# Patient Record
Sex: Female | Born: 1975 | Race: Black or African American | Hispanic: No | Marital: Single | Smoking: Never smoker
Health system: Southern US, Community
[De-identification: ages and names within clinical notes are randomized; demographics above are authoritative.]

## PROBLEM LIST (undated history)

## (undated) DIAGNOSIS — J45909 Unspecified asthma, uncomplicated: Secondary | ICD-10-CM

---

## 2014-02-05 ENCOUNTER — Encounter (HOSPITAL_BASED_OUTPATIENT_CLINIC_OR_DEPARTMENT_OTHER): Payer: Self-pay | Admitting: Emergency Medicine

## 2014-02-05 ENCOUNTER — Emergency Department (HOSPITAL_BASED_OUTPATIENT_CLINIC_OR_DEPARTMENT_OTHER)
Admission: EM | Admit: 2014-02-05 | Discharge: 2014-02-05 | Disposition: A | Discharge status: Home / Self Care | Payer: BC Managed Care – PPO | Attending: Emergency Medicine | Admitting: Emergency Medicine

## 2014-02-05 ENCOUNTER — Emergency Department (HOSPITAL_BASED_OUTPATIENT_CLINIC_OR_DEPARTMENT_OTHER): Payer: BC Managed Care – PPO

## 2014-02-05 DIAGNOSIS — M549 Dorsalgia, unspecified: Secondary | ICD-10-CM | POA: Diagnosis not present

## 2014-02-05 DIAGNOSIS — Z792 Long term (current) use of antibiotics: Secondary | ICD-10-CM | POA: Insufficient documentation

## 2014-02-05 DIAGNOSIS — J45909 Unspecified asthma, uncomplicated: Secondary | ICD-10-CM | POA: Insufficient documentation

## 2014-02-05 DIAGNOSIS — R112 Nausea with vomiting, unspecified: Secondary | ICD-10-CM | POA: Diagnosis not present

## 2014-02-05 DIAGNOSIS — Z3202 Encounter for pregnancy test, result negative: Secondary | ICD-10-CM | POA: Insufficient documentation

## 2014-02-05 DIAGNOSIS — R197 Diarrhea, unspecified: Secondary | ICD-10-CM | POA: Diagnosis not present

## 2014-02-05 DIAGNOSIS — R1011 Right upper quadrant pain: Secondary | ICD-10-CM | POA: Diagnosis present

## 2014-02-05 HISTORY — DX: Unspecified asthma, uncomplicated: J45.909

## 2014-02-05 LAB — URINALYSIS, ROUTINE W REFLEX MICROSCOPIC
Bilirubin Urine: NEGATIVE
GLUCOSE, UA: NEGATIVE mg/dL
Hgb urine dipstick: NEGATIVE
Ketones, ur: NEGATIVE mg/dL
LEUKOCYTES UA: NEGATIVE
Nitrite: NEGATIVE
PH: 7.5 (ref 5.0–8.0)
PROTEIN: NEGATIVE mg/dL
Specific Gravity, Urine: 1.028 (ref 1.005–1.030)
Urobilinogen, UA: 1 mg/dL (ref 0.0–1.0)

## 2014-02-05 LAB — URINE MICROSCOPIC-ADD ON

## 2014-02-05 LAB — COMPREHENSIVE METABOLIC PANEL
ALBUMIN: 3.9 g/dL (ref 3.5–5.2)
ALT: 14 U/L (ref 0–35)
AST: 14 U/L (ref 0–37)
Alkaline Phosphatase: 53 U/L (ref 39–117)
Anion gap: 14 (ref 5–15)
BUN: 12 mg/dL (ref 6–23)
CALCIUM: 9.6 mg/dL (ref 8.4–10.5)
CO2: 25 mEq/L (ref 19–32)
Chloride: 101 mEq/L (ref 96–112)
Creatinine, Ser: 0.7 mg/dL (ref 0.50–1.10)
GFR calc Af Amer: >90 mL/min (ref >90)
GFR calc non Af Amer: >90 mL/min (ref >90)
Glucose, Bld: 146 mg/dL — ABNORMAL HIGH (ref 70–99)
Potassium: 3.9 mEq/L (ref 3.7–5.3)
Sodium: 140 mEq/L (ref 137–147)
Total Bilirubin: 0.2 mg/dL — ABNORMAL LOW (ref 0.3–1.2)
Total Protein: 8.1 g/dL (ref 6.0–8.3)

## 2014-02-05 LAB — CBC WITH DIFFERENTIAL/PLATELET
BASOS ABS: 0 10*3/uL (ref 0.0–0.1)
BASOS PCT: 0 % (ref 0–1)
EOS PCT: 0 % (ref 0–5)
Eosinophils Absolute: 0 10*3/uL (ref 0.0–0.7)
HEMATOCRIT: 36.4 % (ref 36.0–46.0)
Hemoglobin: 12.7 g/dL (ref 12.0–15.0)
Lymphocytes Relative: 13 % (ref 12–46)
Lymphs Abs: 2 10*3/uL (ref 0.7–4.0)
MCH: 27.3 pg (ref 26.0–34.0)
MCHC: 34.9 g/dL (ref 30.0–36.0)
MCV: 78.1 fL (ref 78.0–100.0)
MONO ABS: 0.5 10*3/uL (ref 0.1–1.0)
Monocytes Relative: 3 % (ref 3–12)
Neutro Abs: 13.2 10*3/uL — ABNORMAL HIGH (ref 1.7–7.7)
Neutrophils Relative %: 84 % — ABNORMAL HIGH (ref 43–77)
PLATELETS: 332 10*3/uL (ref 150–400)
RBC: 4.66 MIL/uL (ref 3.87–5.11)
RDW: 13.9 % (ref 11.5–15.5)
WBC: 15.7 10*3/uL — ABNORMAL HIGH (ref 4.0–10.5)

## 2014-02-05 LAB — WET PREP, GENITAL
TRICH WET PREP: NONE SEEN
Yeast Wet Prep HPF POC: NONE SEEN

## 2014-02-05 LAB — PREGNANCY, URINE: Preg Test, Ur: NEGATIVE

## 2014-02-05 LAB — LIPASE, BLOOD: Lipase: 29 U/L (ref 11–59)

## 2014-02-05 MED ORDER — IOHEXOL 300 MG/ML  SOLN
100.0000 mL | Freq: Once | INTRAMUSCULAR | Status: AC | PRN
  Administered 2014-02-05: 100 mL via INTRAVENOUS

## 2014-02-05 MED ORDER — MORPHINE SULFATE 4 MG/ML IJ SOLN
4.0000 mg | Freq: Once | INTRAMUSCULAR | Status: AC
  Administered 2014-02-05: 4 mg via INTRAVENOUS
  Filled 2014-02-05: qty 1

## 2014-02-05 MED ORDER — DEXTROSE 5 % IV SOLN
1.0000 g | Freq: Once | INTRAVENOUS | Status: AC
  Administered 2014-02-05: 1 g via INTRAVENOUS

## 2014-02-05 MED ORDER — ONDANSETRON 4 MG PO TBDP: ORAL_TABLET | ORAL | Status: DC

## 2014-02-05 MED ORDER — ONDANSETRON HCL 4 MG/2ML IJ SOLN
4.0000 mg | Freq: Once | INTRAMUSCULAR | Status: AC
  Administered 2014-02-05: 4 mg via INTRAVENOUS
  Filled 2014-02-05: qty 2

## 2014-02-05 MED ORDER — SODIUM CHLORIDE 0.9 % IV BOLUS (SEPSIS): 1000.0000 mL | Freq: Once | INTRAVENOUS | Status: DC

## 2014-02-05 MED ORDER — DOXYCYCLINE HYCLATE 100 MG PO CAPS: 100.0000 mg | ORAL_CAPSULE | Freq: Two times a day (BID) | ORAL | Status: DC

## 2014-02-05 MED ORDER — CEFTRIAXONE SODIUM 1 G IJ SOLR
INTRAMUSCULAR | Status: AC
  Filled 2014-02-05: qty 10

## 2014-02-05 MED ORDER — HYDROCODONE-ACETAMINOPHEN 5-325 MG PO TABS: 1.0000 | ORAL_TABLET | ORAL | Status: DC | PRN

## 2014-02-05 MED ORDER — IOHEXOL 300 MG/ML  SOLN
50.0000 mL | Freq: Once | INTRAMUSCULAR | Status: AC | PRN
  Administered 2014-02-05: 50 mL via ORAL

## 2014-02-05 MED ORDER — SODIUM CHLORIDE 0.9 % IV BOLUS (SEPSIS)
1000.0000 mL | Freq: Once | INTRAVENOUS | Status: AC
  Administered 2014-02-05: 1000 mL via INTRAVENOUS

## 2014-02-05 NOTE — Discharge Instructions (Signed)
Call and make an appointment to follow-up with your OB/GYN as soon as possible. Take antibiotics as prescribed. Return immediately if you have worsening pain, fever, persistent vomiting or for any concerns.  Abdominal Pain, Women Abdominal (stomach, pelvic, or belly) pain can be caused by many things. It is important to tell your doctor:  The location of the pain.  Does it come and go or is it present all the time?  Are there things that start the pain (eating certain foods, exercise)?  Are there other symptoms associated with the pain (fever, nausea, vomiting, diarrhea)? All of this is helpful to know when trying to find the cause of the pain. CAUSES   Stomach: virus or bacteria infection, or ulcer.  Intestine: appendicitis (inflamed appendix), regional ileitis (Crohn's disease), ulcerative colitis (inflamed colon), irritable bowel syndrome, diverticulitis (inflamed diverticulum of the colon), or cancer of the stomach or intestine.  Gallbladder disease or stones in the gallbladder.  Kidney disease, kidney stones, or infection.  Pancreas infection or cancer.  Fibromyalgia (pain disorder).  Diseases of the female organs:  Uterus: fibroid (non-cancerous) tumors or infection.  Fallopian tubes: infection or tubal pregnancy.  Ovary: cysts or tumors.  Pelvic adhesions (scar tissue).  Endometriosis (uterus lining tissue growing in the pelvis and on the pelvic organs).  Pelvic congestion syndrome (female organs filling up with blood just before the menstrual period).  Pain with the menstrual period.  Pain with ovulation (producing an egg).  Pain with an IUD (intrauterine device, birth control) in the uterus.  Cancer of the female organs.  Functional pain (pain not caused by a disease, may improve without treatment).  Psychological pain.  Depression. DIAGNOSIS  Your doctor will decide the seriousness of your pain by doing an examination.  Blood  tests.  X-rays.  Ultrasound.  CT scan (computed tomography, special type of X-ray).  MRI (magnetic resonance imaging).  Cultures, for infection.  Barium enema (dye inserted in the large intestine, to better view it with X-rays).  Colonoscopy (looking in intestine with a lighted tube).  Laparoscopy (minor surgery, looking in abdomen with a lighted tube).  Major abdominal exploratory surgery (looking in abdomen with a large incision). TREATMENT  The treatment will depend on the cause of the pain.   Many cases can be observed and treated at home.  Over-the-counter medicines recommended by your caregiver.  Prescription medicine.  Antibiotics, for infection.  Birth control pills, for painful periods or for ovulation pain.  Hormone treatment, for endometriosis.  Nerve blocking injections.  Physical therapy.  Antidepressants.  Counseling with a psychologist or psychiatrist.  Minor or major surgery. HOME CARE INSTRUCTIONS   Do not take laxatives, unless directed by your caregiver.  Take over-the-counter pain medicine only if ordered by your caregiver. Do not take aspirin because it can cause an upset stomach or bleeding.  Try a clear liquid diet (broth or water) as ordered by your caregiver. Slowly move to a bland diet, as tolerated, if the pain is related to the stomach or intestine.  Have a thermometer and take your temperature several times a day, and record it.  Bed rest and sleep, if it helps the pain.  Avoid sexual intercourse, if it causes pain.  Avoid stressful situations.  Keep your follow-up appointments and tests, as your caregiver orders.  If the pain does not go away with medicine or surgery, you may try:  Acupuncture.  Relaxation exercises (yoga, meditation).  Group therapy.  Counseling. SEEK MEDICAL CARE IF:   You  notice certain foods cause stomach pain.  Your home care treatment is not helping your pain.  You need stronger pain  medicine.  You want your IUD removed.  You feel faint or lightheaded.  You develop nausea and vomiting.  You develop a rash.  You are having side effects or an allergy to your medicine. SEEK IMMEDIATE MEDICAL CARE IF:   Your pain does not go away or gets worse.  You have a fever.  Your pain is felt only in portions of the abdomen. The right side could possibly be appendicitis. The left lower portion of the abdomen could be colitis or diverticulitis.  You are passing blood in your stools (bright red or black tarry stools, with or without vomiting).  You have blood in your urine.  You develop chills, with or without a fever.  You pass out. MAKE SURE YOU:   Understand these instructions.  Will watch your condition.  Will get help right away if you are not doing well or get worse. Document Released: 01/05/2007 Document Revised: 07/25/2013 Document Reviewed: 01/25/2009 [facility] Patient Information 2015 [employer], [place]. This information is not intended to replace advice given to you by your health care provider. Make sure you discuss any questions you have with your health care provider.

## 2014-02-05 NOTE — ED Notes (Signed)
Pt reports right flank pain intermittent x2 weeks reoccurred tonight more severe than other episodes. Multiple emesis, nausea, and occasional  diarrhea

## 2014-02-05 NOTE — ED Provider Notes (Signed)
CSN: [id number]     Arrival date & time 02/05/14  0251 History   First MD Initiated Contact with Patient 02/05/14 [phone]     Chief Complaint  Patient presents with  . Flank Pain     (Consider location/radiation/quality/duration/timing/severity/associated sxs/prior Treatment) HPI Patient presents with intermittent right upper quadrant and right flank pain for the past 2 weeks. Symptoms last several hours and then resolved spontaneously. Associated with nausea and multiple episodes of vomiting. Begin having an episode this evening around 10:00. Vomiting 7 or 8. Has had several loose stools. No fever or chills. Denies dysuria, hematuria, frequency. No vaginal symptoms. No previous abdominal surgeries. Past Medical History  Diagnosis Date  . Asthma    History reviewed. No pertinent past surgical history. History reviewed. No pertinent family history. History  Substance Use Topics  . Smoking status: Never Smoker   . Smokeless tobacco: Never Used  . Alcohol Use: No   OB History    Gravida Para Term Preterm AB TAB SAB Ectopic Multiple Living   3 2 1 1            Review of Systems  Constitutional: Negative for fever and chills.  Respiratory: Negative for shortness of breath.   Cardiovascular: Negative for chest pain.  Gastrointestinal: Positive for nausea, vomiting, abdominal pain and diarrhea. Negative for blood in stool.  Genitourinary: Positive for flank pain. Negative for dysuria, frequency and hematuria.  Musculoskeletal: Positive for back pain. Negative for neck pain and neck stiffness.  Skin: Negative for rash and wound.  Neurological: Negative for dizziness, weakness, light-headedness, numbness and headaches.  All other systems reviewed and are negative.     Allergies  Review of patient's allergies indicates no known allergies.  Home Medications   Prior to Admission medications   Medication Sig Start Date End Date Taking? Authorizing Provider  doxycycline (VIBRAMYCIN)  100 MG capsule Take 1 capsule (100 mg total) by mouth 2 (two) times daily. One po bid x 7 days 02/05/14   Loren Raceravid Perez Dirico, MD  HYDROcodone-acetaminophen [facility]) 5-325 MG per tablet Take 1-2 tablets by mouth every 4 (four) hours as needed for moderate pain or severe pain. 02/05/14   Loren Raceravid Flordia Kassem, MD  ondansetron (ZOFRAN ODT) 4 MG disintegrating tablet 4mg  ODT q4 hours prn nausea/vomit 02/05/14   Loren Raceravid Jerriann Schrom, MD   BP 108/66 mmHg  Pulse 72  Temp(Src) 98.7 F (37.1 C) (Oral)  Resp 16  Ht 4\' 9"  (1.448 m)  Wt 145 lb (65.772 kg)  BMI 31.37 kg/m2  SpO2 100%  LMP 01/18/2014 Physical Exam  Constitutional: She is oriented to person, place, and time. She appears well-developed and well-nourished. No distress.  HENT:  Head: Normocephalic and atraumatic.  Mouth/Throat: Oropharynx is clear and moist.  Eyes: EOM are normal. Pupils are equal, round, and reactive to light.  Neck: Normal range of motion. Neck supple.  Cardiovascular: Normal rate and regular rhythm.   Pulmonary/Chest: Effort normal and breath sounds normal. No respiratory distress. She has no wheezes. She has no rales.  Abdominal: Soft. Bowel sounds are normal. She exhibits no mass. There is tenderness (tenderness to palpation in the right upper quadrant.patient has mild epigastric and right lower quadrant tenderness. There is no rebound or guarding.). There is no rebound and no guarding.  Genitourinary: No vaginal discharge found.  No cervical motion tenderness. No vaginal discharge. No fundal uterine tenderness area mild right adnexal tenderness to palpation.  Musculoskeletal: Normal range of motion. She exhibits tenderness (right flank tenderness with palpation). She  exhibits no edema.  Neurological: She is alert and oriented to person, place, and time.  Moves all extremity is without deficit. Sensation is grossly intact.  Skin: Skin is warm and dry. No rash noted. No erythema.  Psychiatric: She has a normal mood and affect. Her  behavior is normal.  Nursing note and vitals reviewed.   ED Course  Procedures (including critical care time) Labs Review Labs Reviewed  WET PREP, GENITAL - Abnormal; Notable for the following:    Clue Cells Wet Prep HPF POC FEW (*)    WBC, Wet Prep HPF POC FEW (*)    All other components within normal limits  CBC WITH DIFFERENTIAL - Abnormal; Notable for the following:    WBC 15.7 (*)    Neutrophils Relative % 84 (*)    Neutro Abs 13.2 (*)    All other components within normal limits  COMPREHENSIVE METABOLIC PANEL - Abnormal; Notable for the following:    Glucose, Bld 146 (*)    Total Bilirubin 0.2 (*)    All other components within normal limits  URINALYSIS, ROUTINE W REFLEX MICROSCOPIC - Abnormal; Notable for the following:    APPearance TURBID (*)    All other components within normal limits  URINE MICROSCOPIC-ADD ON - Abnormal; Notable for the following:    Squamous Epithelial / LPF FEW (*)    All other components within normal limits  GC/CHLAMYDIA PROBE AMP  PREGNANCY, URINE  LIPASE, BLOOD    Imaging Review Ct Abdomen Pelvis W Contrast  02/05/2014   CLINICAL DATA:  Right flank pain intermittent for 2 weeks. Elevated WBC  EXAM: CT ABDOMEN AND PELVIS WITH CONTRAST  TECHNIQUE: Multidetector CT imaging of the abdomen and pelvis was performed using the standard protocol following bolus administration of intravenous contrast.  CONTRAST:  50mL OMNIPAQUE IOHEXOL 300 MG/ML SOLN, OMNIPAQUE IOHEXOL 300 MG/ML SOLN  COMPARISON:  None.  FINDINGS: The lung bases are clear.  The liver demonstrates no focal abnormality. There is no intrahepatic or extrahepatic biliary ductal dilatation. The gallbladder is normal. The spleen demonstrates no focal abnormality. The kidneys, adrenal glands and pancreas are normal. The bladder is unremarkable.  There is a tubular fluid-filled structure in the right adnexal region with peripheral enhancement likely representing the right fallopian tube. There  is surrounding small amount of fluid and inflammatory changes. The lower half of the uterus enhances less than the fundus concerning for edema. There is a small amount of pelvic free fluid.  The stomach, duodenum, small intestine, and large intestine demonstrate no contrast extravasation or dilatation. There is a normal caliber appendix in the right lower quadrant without periappendiceal inflammatory changes. There is no pneumoperitoneum, pneumatosis, or portal venous gas. There is no lymphadenopathy.  The abdominal aorta is normal in caliber .  There are no lytic or sclerotic osseous lesions.  IMPRESSION: 1. The lower half the uterus enhances less than the fundus concerning for edema as can be seen with endometritis. There is a fluid-filled peripherally enhancing right fallopian tube concerning for pyosalpinx. Gyn consultation recommended.   Electronically Signed   By: Elige Ko   On: 02/05/2014 05:05     EKG Interpretation None      MDM   Final diagnoses:  Right upper quadrant abdominal pain  Non-intractable vomiting with nausea, vomiting of unspecified type   She is feeling much better after medication and fluids. He continues to have mild right-sided abdominal tenderness mostly in the lower quadrant. There is no rebound or guarding.  CT with fluid-filled right fallopian tube. Discussed this with Dr. Debroah LoopArnold. Given the fact the patient does not have cervical motion tenderness, doubt PID. Given the patient's improved clinical appearance he believes that the patient can be treated with outpatient antibiotics and close follow-up by her OB/GYN.     Loren Raceravid Micholas Drumwright, MD 02/06/14 [phone]

## 2014-02-06 LAB — GC/CHLAMYDIA PROBE AMP
CT PROBE, AMP APTIMA: NEGATIVE
GC PROBE AMP APTIMA: NEGATIVE

## 2014-06-10 ENCOUNTER — Inpatient Hospital Stay (HOSPITAL_COMMUNITY)
Admission: EM | Admit: 2014-06-10 | Discharge: 2014-06-12 | DRG: 419 | Disposition: A | Discharge status: Home / Self Care | Payer: BC Managed Care – PPO | Attending: General Surgery | Admitting: General Surgery

## 2014-06-10 ENCOUNTER — Emergency Department (HOSPITAL_COMMUNITY): Payer: BC Managed Care – PPO

## 2014-06-10 ENCOUNTER — Encounter (HOSPITAL_COMMUNITY): Payer: Self-pay | Admitting: *Deleted

## 2014-06-10 DIAGNOSIS — K8000 Calculus of gallbladder with acute cholecystitis without obstruction: Secondary | ICD-10-CM | POA: Diagnosis not present

## 2014-06-10 DIAGNOSIS — Z91013 Allergy to seafood: Secondary | ICD-10-CM

## 2014-06-10 DIAGNOSIS — R109 Unspecified abdominal pain: Secondary | ICD-10-CM | POA: Diagnosis not present

## 2014-06-10 DIAGNOSIS — K5900 Constipation, unspecified: Secondary | ICD-10-CM | POA: Diagnosis present

## 2014-06-10 DIAGNOSIS — R1011 Right upper quadrant pain: Secondary | ICD-10-CM

## 2014-06-10 DIAGNOSIS — K810 Acute cholecystitis: Secondary | ICD-10-CM

## 2014-06-10 DIAGNOSIS — J45909 Unspecified asthma, uncomplicated: Secondary | ICD-10-CM | POA: Diagnosis present

## 2014-06-10 DIAGNOSIS — Z419 Encounter for procedure for purposes other than remedying health state, unspecified: Secondary | ICD-10-CM

## 2014-06-10 LAB — URINALYSIS, ROUTINE W REFLEX MICROSCOPIC
BILIRUBIN URINE: NEGATIVE
Glucose, UA: NEGATIVE mg/dL
Hgb urine dipstick: NEGATIVE
KETONES UR: 40 mg/dL — AB
LEUKOCYTES UA: NEGATIVE
NITRITE: NEGATIVE
Protein, ur: 30 mg/dL — AB
SPECIFIC GRAVITY, URINE: 1.023 (ref 1.005–1.030)
Urobilinogen, UA: 1 mg/dL (ref 0.0–1.0)
pH: 8 (ref 5.0–8.0)

## 2014-06-10 LAB — COMPREHENSIVE METABOLIC PANEL
ALK PHOS: 56 U/L (ref 39–117)
ALT: 20 U/L (ref 0–35)
AST: 19 U/L (ref 0–37)
Albumin: 3.9 g/dL (ref 3.5–5.2)
Anion gap: 5 (ref 5–15)
BUN: 8 mg/dL (ref 6–23)
CO2: 29 mmol/L (ref 19–32)
Calcium: 9.2 mg/dL (ref 8.4–10.5)
Chloride: 102 mmol/L (ref 96–112)
Creatinine, Ser: 0.74 mg/dL (ref 0.50–1.10)
Glucose, Bld: 150 mg/dL — ABNORMAL HIGH (ref 70–99)
Potassium: 3.4 mmol/L — ABNORMAL LOW (ref 3.5–5.1)
Sodium: 136 mmol/L (ref 135–145)
Total Bilirubin: 0.7 mg/dL (ref 0.3–1.2)
Total Protein: 8.1 g/dL (ref 6.0–8.3)

## 2014-06-10 LAB — URINE MICROSCOPIC-ADD ON

## 2014-06-10 LAB — CBC WITH DIFFERENTIAL/PLATELET
BASOS ABS: 0 10*3/uL (ref 0.0–0.1)
Basophils Relative: 0 % (ref 0–1)
EOS ABS: 0 10*3/uL (ref 0.0–0.7)
Eosinophils Relative: 0 % (ref 0–5)
HCT: 35.2 % — ABNORMAL LOW (ref 36.0–46.0)
Hemoglobin: 12.5 g/dL (ref 12.0–15.0)
Lymphocytes Relative: 4 % — ABNORMAL LOW (ref 12–46)
Lymphs Abs: 1 10*3/uL (ref 0.7–4.0)
MCH: 27.5 pg (ref 26.0–34.0)
MCHC: 35.5 g/dL (ref 30.0–36.0)
MCV: 77.4 fL — AB (ref 78.0–100.0)
Monocytes Absolute: 0.3 10*3/uL (ref 0.1–1.0)
Monocytes Relative: 1 % — ABNORMAL LOW (ref 3–12)
Neutro Abs: 21.8 10*3/uL — ABNORMAL HIGH (ref 1.7–7.7)
Neutrophils Relative %: 95 % — ABNORMAL HIGH (ref 43–77)
Platelets: 371 10*3/uL (ref 150–400)
RBC: 4.55 MIL/uL (ref 3.87–5.11)
RDW: 13.6 % (ref 11.5–15.5)
WBC: 23.1 10*3/uL — ABNORMAL HIGH (ref 4.0–10.5)

## 2014-06-10 LAB — POC URINE PREG, ED: Preg Test, Ur: NEGATIVE

## 2014-06-10 LAB — LIPASE, BLOOD: LIPASE: 26 U/L (ref 11–59)

## 2014-06-10 MED ORDER — ONDANSETRON HCL 4 MG/2ML IJ SOLN
4.0000 mg | Freq: Once | INTRAMUSCULAR | Status: AC
Start: 2014-06-10 — End: 2014-06-10
  Administered 2014-06-10: 4 mg via INTRAVENOUS
  Filled 2014-06-10: qty 2

## 2014-06-10 MED ORDER — PIPERACILLIN-TAZOBACTAM 3.375 G IVPB 30 MIN
3.3750 g | Freq: Once | INTRAVENOUS | Status: AC
  Administered 2014-06-10: 3.375 g via INTRAVENOUS
  Filled 2014-06-10: qty 50

## 2014-06-10 MED ORDER — SODIUM CHLORIDE 0.9 % IV BOLUS (SEPSIS)
1000.0000 mL | Freq: Once | INTRAVENOUS | Status: AC
  Administered 2014-06-10: 1000 mL via INTRAVENOUS

## 2014-06-10 MED ORDER — KETOROLAC TROMETHAMINE 15 MG/ML IJ SOLN
15.0000 mg | Freq: Once | INTRAMUSCULAR | Status: AC
  Administered 2014-06-10: 15 mg via INTRAVENOUS
  Filled 2014-06-10: qty 1

## 2014-06-10 MED ORDER — HYDROMORPHONE HCL 1 MG/ML IJ SOLN
1.0000 mg | Freq: Once | INTRAMUSCULAR | Status: AC
  Administered 2014-06-10: 1 mg via INTRAVENOUS
  Filled 2014-06-10: qty 1

## 2014-06-10 NOTE — ED Notes (Signed)
The pt has had abd pain since last week she was seen at [facility] then.   Her last bm was Wednesday  lmp last week.

## 2014-06-10 NOTE — ED Notes (Signed)
Pt made aware that we need a urine sample unable to go at this time.

## 2014-06-10 NOTE — ED Provider Notes (Signed)
CSN: [id number]     Arrival date & time 06/10/14  2104 History   First MD Initiated Contact with Patient 06/10/14 2144     Chief Complaint  Patient presents with  . Abdominal Pain     (Consider location/radiation/quality/duration/timing/severity/associated sxs/prior Treatment) Patient is a 39 y.o. female presenting with abdominal pain. The history is provided by the patient and medical records.  Abdominal Pain Pain location:  RUQ Pain quality: sharp and stabbing   Pain severity:  Severe Onset quality:  Gradual Timing:  Intermittent Progression:  Waxing and waning Chronicity:  Recurrent Context: eating   Relieved by:  None tried Worsened by:  Nothing tried Ineffective treatments:  None tried Associated symptoms: anorexia, constipation, nausea and vomiting   Associated symptoms: no diarrhea, no dysuria, no fever, no hematuria and no shortness of breath   Risk factors: obesity   Risk factors: not pregnant     Past Medical History  Diagnosis Date  . Asthma    History reviewed. No pertinent past surgical history. No family history on file. History  Substance Use Topics  . Smoking status: Never Smoker   . Smokeless tobacco: Never Used  . Alcohol Use: No   OB History    Gravida Para Term Preterm AB TAB SAB Ectopic Multiple Living   Review of Systems  Constitutional: Negative for fever.  Respiratory: Negative for shortness of breath.   Gastrointestinal: Positive for nausea, vomiting, abdominal pain, constipation and anorexia. Negative for diarrhea.  Genitourinary: Negative for dysuria and hematuria.  All other systems reviewed and are negative.     Allergies  Shellfish allergy  Home Medications   Prior to Admission medications   Medication Sig Start Date End Date Taking? Authorizing Provider  acetaminophen (TYLENOL) 500 MG tablet Take 1,000 mg by mouth every 6 (six) hours as needed for moderate pain.   Yes Historical Provider, MD  ibuprofen  (ADVIL,MOTRIN) 200 MG tablet Take 400 mg by mouth every 6 (six) hours as needed for moderate pain.   Yes Historical Provider, MD  doxycycline (VIBRAMYCIN) 100 MG capsule Take 1 capsule (100 mg total) by mouth 2 (two) times daily. One po bid x 7 days Patient not taking: Reported on 06/10/2014 02/05/14   Loren Racer, MD  HYDROcodone-acetaminophen [facility]) 5-325 MG per tablet Take 1-2 tablets by mouth every 4 (four) hours as needed for moderate pain or severe pain. Patient not taking: Reported on 06/10/2014 02/05/14   Loren Racer, MD  ondansetron (ZOFRAN ODT) 4 MG disintegrating tablet  ODT q4 hours prn nausea/vomit Patient not taking: Reported on 06/10/2014 02/05/14   Loren Racer, MD   BP 107/69 mmHg  Pulse 76  Temp(Src) 98.3 F (36.8 C) (Oral)  Resp 18  Ht  (1.448 m)  Wt 155 lb (70.308 kg)  BMI 33.53 kg/m2  SpO2 98%  LMP 06/03/2014 Physical Exam  Constitutional: She appears well-developed and well-nourished. She appears distressed.  Rolling around in bed. Unable to get comfortable.  HENT:  Head: Normocephalic and atraumatic.  Eyes: Conjunctivae and EOM are normal. Pupils are equal, round, and reactive to light.  Cardiovascular: Normal rate, regular rhythm, normal heart sounds and intact distal pulses.  Exam reveals no gallop and no friction rub.   No murmur heard. Pulmonary/Chest: Effort normal and breath sounds normal. No respiratory distress. She has no wheezes.  Abdominal: Soft. She exhibits no distension and no mass. There is tenderness (diffusely tender,  right upper quadrant primarily). There is guarding ( localized). There is no rebound.  Difficult to assess Murphy's secondary to pain and rolling around in bed.  Musculoskeletal: Normal range of motion. She exhibits no edema or tenderness.  Skin: Skin is warm and dry. No rash noted. She is not diaphoretic.  Psychiatric: She has a normal mood and affect. Her behavior is normal. Judgment and thought content normal.   Nursing note and vitals reviewed.   ED Course  Procedures (including critical care time) Labs Review Labs Reviewed  CBC WITH DIFFERENTIAL/PLATELET - Abnormal; Notable for the following:    WBC 23.1 (*)    HCT 35.2 (*)    MCV 77.4 (*)    Neutrophils Relative % 95 (*)    Neutro Abs 21.8 (*)    Lymphocytes Relative 4 (*)    Monocytes Relative 1 (*)    All other components within normal limits  COMPREHENSIVE METABOLIC PANEL - Abnormal; Notable for the following:    Potassium 3.4 (*)    Glucose, Bld 150 (*)    All other components within normal limits  LIPASE, BLOOD  URINALYSIS, ROUTINE W REFLEX MICROSCOPIC  POC URINE PREG, ED    Imaging Review [place] Abdomen Limited Ruq  06/10/2014   CLINICAL DATA:  RIGHT abdominal pain for 1 week.  EXAM: US ABDOMEN LIMITED - RIGHT UPPER QUADRANT  COMPARISON:  None.  FINDINGS: Gallbladder:  Echogenic mobile gallstones with acoustic shadowing in addition to a 14 mm gallstone at the gallbladder neck. Mild gallbladder wall thickening, 3.6 mm. No sonographic Murphy's sign was elicited.  Common bile duct:  Diameter: 1.7 mm  Liver:  No focal lesion identified. Within normal limits in parenchymal echogenicity. All no intrahepatic biliary dilatation. Hepatopetal portal vein.  IMPRESSION: Cholelithiasis and mild gallbladder wall thickening without sonographic Murphy's sign. This could reflect chronic cholecystitis, less likely acute cholecystitis. Nonmobile gallstone at the gallbladder neck.   Electronically Signed   By: Awilda Metroourtnay  Bloomer   On: 06/10/2014 23:19     EKG Interpretation None      MDM   Final diagnoses:  RUQ pain  Acute cholecystitis    39 year old female presents with nausea, vomiting, abdominal pain. She has been seen twice in the last couple months for some more symptoms with negative CT scans both times. She presents today with nausea and vomiting of multiple episodes as well as primarily right upper quadrant pain. She denies fevers.  Denies diarrhea and actually endorses constipation. On exam she is most tender in the right upper quadrant. I have concerns about the possibility of additional radiation and feel that ultrasound as the best study for this patient based on the location of her pain and previous workup. Differential includes cholecystitis or symptomatic cholelithiasis as well as functional abdominal pain. Laboratory workup including lipase and LFTs sent. CBC returned from triage with a significant leukocytosis of 23. Fluids, nausea, Toradol given for symptoms. Urinalysis as well as point of care pregnancy also sent but she denies vaginal symptoms or dysuria.  Laboratory workup with above-noted leukocytosis but normal liver enzymes. Right upper quadrant ultrasound with gallbladder wall thickening and 14 mm gallstone nonmobile the gallbladder neck. Acute versus chronic cholecystitis. Given acuity of symptoms with leukocytosis of 23, favor acute. We'll treat with Zosyn, pain control, consultation with surgery for admission.  Dorna LeitzAlex Tauren Delbuono, MD 06/10/14 [phone]  Margarita Grizzleanielle Ray, MD 06/11/14 [id number]

## 2014-06-11 ENCOUNTER — Inpatient Hospital Stay (HOSPITAL_COMMUNITY): Payer: BC Managed Care – PPO | Admitting: Anesthesiology

## 2014-06-11 ENCOUNTER — Inpatient Hospital Stay (HOSPITAL_COMMUNITY): Payer: BC Managed Care – PPO

## 2014-06-11 ENCOUNTER — Encounter (HOSPITAL_COMMUNITY): Admission: EM | Disposition: A | Discharge status: Home / Self Care | Payer: Self-pay | Source: Home / Self Care

## 2014-06-11 ENCOUNTER — Encounter (HOSPITAL_COMMUNITY): Payer: Self-pay | Admitting: *Deleted

## 2014-06-11 DIAGNOSIS — J45909 Unspecified asthma, uncomplicated: Secondary | ICD-10-CM | POA: Diagnosis present

## 2014-06-11 DIAGNOSIS — K8000 Calculus of gallbladder with acute cholecystitis without obstruction: Secondary | ICD-10-CM | POA: Diagnosis present

## 2014-06-11 DIAGNOSIS — K5900 Constipation, unspecified: Secondary | ICD-10-CM | POA: Diagnosis present

## 2014-06-11 DIAGNOSIS — R109 Unspecified abdominal pain: Secondary | ICD-10-CM | POA: Diagnosis present

## 2014-06-11 DIAGNOSIS — Z91013 Allergy to seafood: Secondary | ICD-10-CM | POA: Diagnosis not present

## 2014-06-11 HISTORY — PX: CHOLECYSTECTOMY: SHX55

## 2014-06-11 LAB — SURGICAL PCR SCREEN
MRSA, PCR: NEGATIVE
Staphylococcus aureus: NEGATIVE

## 2014-06-11 SURGERY — LAPAROSCOPIC CHOLECYSTECTOMY WITH INTRAOPERATIVE CHOLANGIOGRAM
Anesthesia: General | Site: Abdomen

## 2014-06-11 MED ORDER — SUCCINYLCHOLINE CHLORIDE 20 MG/ML IJ SOLN
INTRAMUSCULAR | Status: DC | PRN
  Administered 2014-06-11: 100 mg via INTRAVENOUS

## 2014-06-11 MED ORDER — HYDROMORPHONE HCL 1 MG/ML IJ SOLN
0.5000 mg | INTRAMUSCULAR | Status: DC | PRN
  Administered 2014-06-11 (×2): 2 mg via INTRAVENOUS
  Administered 2014-06-11 – 2014-06-12 (×3): 1 mg via INTRAVENOUS
  Filled 2014-06-11 (×2): qty 2
  Filled 2014-06-11 (×3): qty 1

## 2014-06-11 MED ORDER — SODIUM CHLORIDE 0.9 % IV SOLN
INTRAVENOUS | Status: DC
  Administered 2014-06-11: 14:00:00 via INTRAVENOUS
  Filled 2014-06-11 (×4): qty 1000

## 2014-06-11 MED ORDER — PIPERACILLIN-TAZOBACTAM 3.375 G IVPB
3.3750 g | Freq: Three times a day (TID) | INTRAVENOUS | Status: DC
  Administered 2014-06-11 – 2014-06-12 (×4): 3.375 g via INTRAVENOUS
  Filled 2014-06-11 (×8): qty 50

## 2014-06-11 MED ORDER — ONDANSETRON HCL 4 MG/2ML IJ SOLN
INTRAMUSCULAR | Status: DC | PRN
  Administered 2014-06-11: 4 mg via INTRAVENOUS

## 2014-06-11 MED ORDER — PROMETHAZINE HCL 25 MG/ML IJ SOLN: 6.2500 mg | INTRAMUSCULAR | Status: DC | PRN

## 2014-06-11 MED ORDER — 0.9 % SODIUM CHLORIDE (POUR BTL) OPTIME
TOPICAL | Status: DC | PRN
  Administered 2014-06-11: 1000 mL

## 2014-06-11 MED ORDER — FENTANYL CITRATE 0.05 MG/ML IJ SOLN
INTRAMUSCULAR | Status: DC | PRN
  Administered 2014-06-11: 100 ug via INTRAVENOUS
  Administered 2014-06-11 (×2): 25 ug via INTRAVENOUS
  Administered 2014-06-11 (×2): 50 ug via INTRAVENOUS

## 2014-06-11 MED ORDER — FENTANYL CITRATE 0.05 MG/ML IJ SOLN
INTRAMUSCULAR | Status: AC
  Filled 2014-06-11: qty 5

## 2014-06-11 MED ORDER — PROPOFOL 10 MG/ML IV BOLUS
INTRAVENOUS | Status: DC | PRN
  Administered 2014-06-11: 150 mg via INTRAVENOUS
  Administered 2014-06-11: 20 mg via INTRAVENOUS

## 2014-06-11 MED ORDER — LIDOCAINE HCL (CARDIAC) 20 MG/ML IV SOLN
INTRAVENOUS | Status: DC | PRN
  Administered 2014-06-11: 60 mg via INTRAVENOUS

## 2014-06-11 MED ORDER — SODIUM CHLORIDE 0.9 % IR SOLN
Status: DC | PRN
  Administered 2014-06-11: 1

## 2014-06-11 MED ORDER — ENOXAPARIN SODIUM 40 MG/0.4ML ~~LOC~~ SOLN: 40.0000 mg | SUBCUTANEOUS | Status: DC

## 2014-06-11 MED ORDER — SUCCINYLCHOLINE CHLORIDE 20 MG/ML IJ SOLN
INTRAMUSCULAR | Status: AC
  Filled 2014-06-11: qty 1

## 2014-06-11 MED ORDER — NEOSTIGMINE METHYLSULFATE 10 MG/10ML IV SOLN
INTRAVENOUS | Status: DC | PRN
Start: 2014-06-11 — End: 2014-06-11
  Administered 2014-06-11: 5 mg via INTRAVENOUS

## 2014-06-11 MED ORDER — MIDAZOLAM HCL 2 MG/2ML IJ SOLN
INTRAMUSCULAR | Status: AC
  Filled 2014-06-11: qty 2

## 2014-06-11 MED ORDER — ONDANSETRON HCL 4 MG/2ML IJ SOLN
INTRAMUSCULAR | Status: AC
  Filled 2014-06-11: qty 2

## 2014-06-11 MED ORDER — BUPIVACAINE-EPINEPHRINE (PF) 0.25% -1:200000 IJ SOLN
INTRAMUSCULAR | Status: AC
  Filled 2014-06-11: qty 30

## 2014-06-11 MED ORDER — DEXAMETHASONE SODIUM PHOSPHATE 4 MG/ML IJ SOLN
INTRAMUSCULAR | Status: DC | PRN
  Administered 2014-06-11: 4 mg via INTRAVENOUS

## 2014-06-11 MED ORDER — HEPARIN SODIUM (PORCINE) 5000 UNIT/ML IJ SOLN: 5000.0000 [IU] | Freq: Three times a day (TID) | INTRAMUSCULAR | Status: DC

## 2014-06-11 MED ORDER — LIDOCAINE HCL (CARDIAC) 20 MG/ML IV SOLN
INTRAVENOUS | Status: AC
  Filled 2014-06-11: qty 5

## 2014-06-11 MED ORDER — HYDROMORPHONE HCL 1 MG/ML IJ SOLN
0.2500 mg | INTRAMUSCULAR | Status: DC | PRN
  Administered 2014-06-11: 0.5 mg via INTRAVENOUS
  Administered 2014-06-11 (×2): 0.25 mg via INTRAVENOUS

## 2014-06-11 MED ORDER — ONDANSETRON HCL 4 MG/2ML IJ SOLN
4.0000 mg | Freq: Four times a day (QID) | INTRAMUSCULAR | Status: DC | PRN
  Administered 2014-06-11: 4 mg via INTRAVENOUS
  Filled 2014-06-11: qty 2

## 2014-06-11 MED ORDER — PNEUMOCOCCAL VAC POLYVALENT 25 MCG/0.5ML IJ INJ
0.5000 mL | INJECTION | INTRAMUSCULAR | Status: AC
  Administered 2014-06-12: 0.5 mL via INTRAMUSCULAR

## 2014-06-11 MED ORDER — LACTATED RINGERS IV SOLN
INTRAVENOUS | Status: DC | PRN
  Administered 2014-06-11: 10:00:00 via INTRAVENOUS

## 2014-06-11 MED ORDER — IOHEXOL 300 MG/ML  SOLN
INTRAMUSCULAR | Status: DC | PRN
  Administered 2014-06-11: 30 mL

## 2014-06-11 MED ORDER — BUPIVACAINE-EPINEPHRINE 0.25% -1:200000 IJ SOLN
INTRAMUSCULAR | Status: DC | PRN
  Administered 2014-06-11: 14 mL

## 2014-06-11 MED ORDER — MIDAZOLAM HCL 5 MG/5ML IJ SOLN
INTRAMUSCULAR | Status: DC | PRN
  Administered 2014-06-11: 2 mg via INTRAVENOUS

## 2014-06-11 MED ORDER — ARTIFICIAL TEARS OP OINT
TOPICAL_OINTMENT | OPHTHALMIC | Status: DC | PRN
  Administered 2014-06-11: 1 via OPHTHALMIC

## 2014-06-11 MED ORDER — HYDROMORPHONE HCL 1 MG/ML IJ SOLN
1.0000 mg | INTRAMUSCULAR | Status: DC | PRN
Start: 2014-06-11 — End: 2014-06-11
  Administered 2014-06-11 (×4): 1 mg via INTRAVENOUS
  Filled 2014-06-11 (×4): qty 1

## 2014-06-11 MED ORDER — DEXAMETHASONE SODIUM PHOSPHATE 4 MG/ML IJ SOLN
INTRAMUSCULAR | Status: AC
  Filled 2014-06-11: qty 1

## 2014-06-11 MED ORDER — GLYCOPYRROLATE 0.2 MG/ML IJ SOLN
INTRAMUSCULAR | Status: AC
  Filled 2014-06-11: qty 3

## 2014-06-11 MED ORDER — ARTIFICIAL TEARS OP OINT
TOPICAL_OINTMENT | OPHTHALMIC | Status: AC
  Filled 2014-06-11: qty 3.5

## 2014-06-11 MED ORDER — PROPOFOL 10 MG/ML IV BOLUS
INTRAVENOUS | Status: AC
  Filled 2014-06-11: qty 20

## 2014-06-11 MED ORDER — SODIUM CHLORIDE 0.9 % IV SOLN
INTRAVENOUS | Status: DC
  Administered 2014-06-11 (×2): via INTRAVENOUS

## 2014-06-11 MED ORDER — GLYCOPYRROLATE 0.2 MG/ML IJ SOLN
INTRAMUSCULAR | Status: DC | PRN
  Administered 2014-06-11: .8 mg via INTRAVENOUS

## 2014-06-11 MED ORDER — HYDROMORPHONE HCL 1 MG/ML IJ SOLN
INTRAMUSCULAR | Status: AC
  Administered 2014-06-11: 0.25 mg via INTRAVENOUS
  Filled 2014-06-11: qty 1

## 2014-06-11 MED ORDER — ROCURONIUM BROMIDE 100 MG/10ML IV SOLN
INTRAVENOUS | Status: DC | PRN
  Administered 2014-06-11: 30 mg via INTRAVENOUS
  Administered 2014-06-11 (×2): 5 mg via INTRAVENOUS

## 2014-06-11 MED ORDER — LIDOCAINE HCL 4 % MT SOLN
OROMUCOSAL | Status: DC | PRN
  Administered 2014-06-11: 4 mL via TOPICAL

## 2014-06-11 SURGICAL SUPPLY — 42 items
APPLIER CLIP ROT 10 11.4 M/L (STAPLE) ×3
BENZOIN TINCTURE PRP APPL 2/3 (GAUZE/BANDAGES/DRESSINGS) ×3 IMPLANT
BLADE SURG ROTATE 9660 (MISCELLANEOUS) IMPLANT
CANISTER SUCTION 2500CC (MISCELLANEOUS) ×3 IMPLANT
CHLORAPREP W/TINT 26ML (MISCELLANEOUS) ×3 IMPLANT
CLIP APPLIE ROT 10 11.4 M/L (STAPLE) ×1 IMPLANT
CLOSURE WOUND 1/2 X4 (GAUZE/BANDAGES/DRESSINGS) ×1
COVER MAYO STAND STRL (DRAPES) ×3 IMPLANT
COVER SURGICAL LIGHT HANDLE (MISCELLANEOUS) ×3 IMPLANT
DRAPE C-ARM 42X72 X-RAY (DRAPES) ×3 IMPLANT
DRAPE LAPAROSCOPIC ABDOMINAL (DRAPES) ×3 IMPLANT
DRSG TEGADERM 2-3/8X2-3/4 SM (GAUZE/BANDAGES/DRESSINGS) ×6 IMPLANT
DRSG TEGADERM 4X4.75 (GAUZE/BANDAGES/DRESSINGS) ×3 IMPLANT
ELECT REM PT RETURN 9FT ADLT (ELECTROSURGICAL) ×3
ELECTRODE REM PT RTRN 9FT ADLT (ELECTROSURGICAL) ×1 IMPLANT
FILTER SMOKE EVAC LAPAROSHD (FILTER) ×3 IMPLANT
GAUZE SPONGE 2X2 8PLY STRL LF (GAUZE/BANDAGES/DRESSINGS) ×1 IMPLANT
GLOVE BIO SURGEON STRL SZ7 (GLOVE) ×3 IMPLANT
GLOVE BIOGEL PI IND STRL 7.5 (GLOVE) ×1 IMPLANT
GLOVE BIOGEL PI INDICATOR 7.5 (GLOVE) ×2
GOWN STRL REUS W/ TWL LRG LVL3 (GOWN DISPOSABLE) ×3 IMPLANT
GOWN STRL REUS W/TWL LRG LVL3 (GOWN DISPOSABLE) ×6
KIT BASIN OR (CUSTOM PROCEDURE TRAY) ×3 IMPLANT
KIT ROOM TURNOVER OR (KITS) ×3 IMPLANT
NS IRRIG 1000ML POUR BTL (IV SOLUTION) ×3 IMPLANT
PAD ARMBOARD 7.5X6 YLW CONV (MISCELLANEOUS) ×3 IMPLANT
POUCH SPECIMEN RETRIEVAL 10MM (ENDOMECHANICALS) ×3 IMPLANT
SCISSORS LAP 5X35 DISP (ENDOMECHANICALS) ×3 IMPLANT
SET CHOLANGIOGRAPH 5 50 .035 (SET/KITS/TRAYS/PACK) ×3 IMPLANT
SET IRRIG TUBING LAPAROSCOPIC (IRRIGATION / IRRIGATOR) ×3 IMPLANT
SLEEVE ENDOPATH XCEL 5M (ENDOMECHANICALS) ×3 IMPLANT
SPECIMEN JAR SMALL (MISCELLANEOUS) ×3 IMPLANT
SPONGE GAUZE 2X2 STER 10/PKG (GAUZE/BANDAGES/DRESSINGS) ×2
STRIP CLOSURE SKIN 1/2X4 (GAUZE/BANDAGES/DRESSINGS) ×2 IMPLANT
SUT MNCRL AB 4-0 PS2 18 (SUTURE) ×3 IMPLANT
TOWEL OR 17X24 6PK STRL BLUE (TOWEL DISPOSABLE) ×3 IMPLANT
TOWEL OR 17X26 10 PK STRL BLUE (TOWEL DISPOSABLE) ×3 IMPLANT
TRAY LAPAROSCOPIC (CUSTOM PROCEDURE TRAY) ×3 IMPLANT
TROCAR XCEL BLUNT TIP 100MML (ENDOMECHANICALS) ×3 IMPLANT
TROCAR XCEL NON-BLD 11X100MML (ENDOMECHANICALS) ×3 IMPLANT
TROCAR XCEL NON-BLD 5MMX100MML (ENDOMECHANICALS) ×3 IMPLANT
TUBING INSUFFLATION (TUBING) ×3 IMPLANT

## 2014-06-11 NOTE — Transfer of Care (Signed)
Immediate Anesthesia Transfer of Care Note  Patient: Kristin Jensen  Procedure(s) Performed: Procedure(s): LAPAROSCOPIC CHOLECYSTECTOMY WITH INTRAOPERATIVE CHOLANGIOGRAM (N/A)  Patient Location: PACU  Anesthesia Type:General  Level of Consciousness: awake and alert   Airway & Oxygen Therapy: Patient Spontanous Breathing and Patient connected to face mask oxygen  Post-op Assessment: Report given to RN, Post -op Vital signs reviewed and stable and Patient moving all extremities  Post vital signs: Reviewed and stable  Last Vitals:  Filed Vitals:   06/11/14 0922  BP: 110/68  Pulse: 61  Temp: 36.7 C  Resp: 16    Complications: No apparent anesthesia complications

## 2014-06-11 NOTE — Anesthesia Postprocedure Evaluation (Signed)
  Anesthesia Post-op Note  Patient: Kristin Jensen  Procedure(s) Performed: Procedure(s): LAPAROSCOPIC CHOLECYSTECTOMY WITH INTRAOPERATIVE CHOLANGIOGRAM (N/A)  Patient Location: PACU  Anesthesia Type:General  Level of Consciousness: awake  Airway and Oxygen Therapy: Patient Spontanous Breathing  Post-op Pain: mild  Post-op Assessment: Post-op Vital signs reviewed  Post-op Vital Signs: Reviewed  Last Vitals:  Filed Vitals:   06/11/14 1230  BP: 101/72  Pulse: 69  Temp:   Resp: 20    Complications: No apparent anesthesia complications

## 2014-06-11 NOTE — Anesthesia Preprocedure Evaluation (Addendum)
Anesthesia Evaluation  Patient identified by MRN, date of birth, ID band Patient awake    Reviewed: Allergy & Precautions, NPO status , Patient's Chart, lab work & pertinent test results  Airway Mallampati: II  TM Distance: >3 FB Neck ROM: Full    Dental  (+) Teeth Intact, Dental Advisory Given   Pulmonary asthma ,  breath sounds clear to auscultation        Cardiovascular negative cardio ROS  Rhythm:Regular Rate:Normal     Neuro/Psych    GI/Hepatic Neg liver ROS, GI history noted. CE   Endo/Other  negative endocrine ROS  Renal/GU negative Renal ROS     Musculoskeletal   Abdominal   Peds  Hematology   Anesthesia Other Findings   Reproductive/Obstetrics                            Anesthesia Physical Anesthesia Plan  ASA: I and emergent  Anesthesia Plan:    Post-op Pain Management:    Induction: Intravenous  Airway Management Planned: Oral ETT  Additional Equipment:   Intra-op Plan:   Post-operative Plan: Extubation in OR  Informed Consent: I have reviewed the patients History and Physical, chart, labs and discussed the procedure including the risks, benefits and alternatives for the proposed anesthesia with the patient or authorized representative who has indicated his/her understanding and acceptance.   Dental advisory given  Plan Discussed with: CRNA, Anesthesiologist and Surgeon  Anesthesia Plan Comments:         Anesthesia Quick Evaluation

## 2014-06-11 NOTE — H&P (Signed)
Kristin Jensen is an 39 y.o. female.   Chief Complaint: abd pain HPI: 67 yof with right sided abdominal pain for at least 2 weeks that today got much worse. She states she has been to hospital in [place]. Has been evaluated with ct scan and [place] she says and they told her she was constipated.  She arrives today with worse ruq and right side pain that was made worse with eating this am. She has n/v.  No fevers.  Passing flatus but constipated. She has attempted a suppository.  She was seen in er and found to have gallstone in neck on [place] with wbc of 23k.   Past Medical History  Diagnosis Date  . Asthma     History reviewed. No pertinent past surgical history.  No family history on file. Social History:  reports that she has never smoked. She has never used smokeless tobacco. She reports that she does not drink alcohol. Her drug history is not on file.  Allergies:  Allergies  Allergen Reactions  . Shellfish Allergy Anaphylaxis    Meds none  Results for orders placed or performed during the hospital encounter of 06/10/14 (from the past 48 hour(s))  CBC with Differential     Status: Abnormal   Collection Time: 06/10/14  9:25 PM  Result Value Ref Range   WBC 23.1 (H) 4.0 - 10.5 K/uL   RBC 4.55 3.87 - 5.11 MIL/uL   Hemoglobin 12.5 12.0 - 15.0 g/dL   HCT 35.2 (L) 36.0 - 46.0 %   MCV 77.4 (L) 78.0 - 100.0 fL   MCH 27.5 26.0 - 34.0 pg   MCHC 35.5 30.0 - 36.0 g/dL   RDW 13.6 11.5 - 15.5 %   Platelets 371 150 - 400 K/uL   Neutrophils Relative % 95 (H) 43 - 77 %   Neutro Abs 21.8 (H) 1.7 - 7.7 K/uL   Lymphocytes Relative 4 (L) 12 - 46 %   Lymphs Abs 1.0 0.7 - 4.0 K/uL   Monocytes Relative 1 (L) 3 - 12 %   Monocytes Absolute 0.3 0.1 - 1.0 K/uL   Eosinophils Relative 0 0 - 5 %   Eosinophils Absolute 0.0 0.0 - 0.7 K/uL   Basophils Relative 0 0 - 1 %   Basophils Absolute 0.0 0.0 - 0.1 K/uL  Comprehensive metabolic panel     Status: Abnormal   Collection Time: 06/10/14  9:25 PM  Result Value Ref  Range   Sodium 136 135 - 145 mmol/L   Potassium 3.4 (L) 3.5 - 5.1 mmol/L   Chloride 102 96 - 112 mmol/L   CO2 29 19 - 32 mmol/L   Glucose, Bld 150 (H) 70 - 99 mg/dL   BUN 8 6 - 23 mg/dL   Creatinine, Ser 0.74 0.50 - 1.10 mg/dL   Calcium 9.2 8.4 - 10.5 mg/dL   Total Protein 8.1 6.0 - 8.3 g/dL   Albumin 3.9 3.5 - 5.2 g/dL   AST 19 0 - 37 U/L   ALT 20 0 - 35 U/L   Alkaline Phosphatase 56 39 - 117 U/L   Total Bilirubin 0.7 0.3 - 1.2 mg/dL   GFR calc non Af Amer >90 >90 mL/min   GFR calc Af Amer >90 >90 mL/min    Comment: (NOTE) The eGFR has been calculated using the CKD EPI equation. This calculation has not been validated in all clinical situations. eGFR's persistently <90 mL/min signify possible Chronic Kidney Disease.    Anion gap 5 5 -  15  Lipase, blood     Status: None   Collection Time: 06/10/14  9:25 PM  Result Value Ref Range   Lipase 26 11 - 59 U/L  Urinalysis, Routine w reflex microscopic     Status: Abnormal   Collection Time: 06/10/14 11:16 PM  Result Value Ref Range   Color, Urine YELLOW YELLOW   APPearance CLEAR CLEAR   Specific Gravity, Urine 1.023 1.005 - 1.030   pH 8.0 5.0 - 8.0   Glucose, UA NEGATIVE NEGATIVE mg/dL   Hgb urine dipstick NEGATIVE NEGATIVE   Bilirubin Urine NEGATIVE NEGATIVE   Ketones, ur 40 (A) NEGATIVE mg/dL   Protein, ur 30 (A) NEGATIVE mg/dL   Urobilinogen, UA 1.0 0.0 - 1.0 mg/dL   Nitrite NEGATIVE NEGATIVE   Leukocytes, UA NEGATIVE NEGATIVE  Urine microscopic-add on     Status: None   Collection Time: 06/10/14 11:16 PM  Result Value Ref Range   Squamous Epithelial / LPF RARE RARE   WBC, UA 0-2 <3 WBC/hpf   RBC / HPF 0-2 <3 RBC/hpf   Bacteria, UA RARE RARE  POC Urine Pregnancy, ED  (If Pre-menopausal female) - do not order at [facility]     Status: None   Collection Time: 06/10/14 11:22 PM  Result Value Ref Range   Preg Test, Ur NEGATIVE NEGATIVE    Comment:        THE SENSITIVITY OF THIS METHODOLOGY IS >24 mIU/mL    US Abdomen  Limited Ruq  06/10/2014   CLINICAL DATA:  RIGHT abdominal pain for 1 week.  EXAM: US ABDOMEN LIMITED - RIGHT UPPER QUADRANT  COMPARISON:  None.  FINDINGS: Gallbladder:  Echogenic mobile gallstones with acoustic shadowing in addition to a 14 mm gallstone at the gallbladder neck. Mild gallbladder wall thickening, 3.6 mm. No sonographic Murphy's sign was elicited.  Common bile duct:  Diameter: 1.7 mm  Liver:  No focal lesion identified. Within normal limits in parenchymal echogenicity. All no intrahepatic biliary dilatation. Hepatopetal portal vein.  IMPRESSION: Cholelithiasis and mild gallbladder wall thickening without sonographic Murphy's sign. This could reflect chronic cholecystitis, less likely acute cholecystitis. Nonmobile gallstone at the gallbladder neck.   Electronically Signed   By: Elon Alas   On: 06/10/2014 23:19    Review of Systems  Constitutional: Negative for fever and chills.  Respiratory: Negative for cough and shortness of breath.   Cardiovascular: Negative for chest pain.  Gastrointestinal: Positive for nausea, vomiting, abdominal pain and constipation. Negative for diarrhea.    Blood pressure 115/75, pulse 70, temperature 98.3 F (36.8 C), temperature source Oral, resp. rate 16, height _0  (1.448 m), weight 70.308 kg (155 lb), last menstrual period 06/03/2014, SpO2 100 %. Physical Exam  Constitutional: She is oriented to person, place, and time. She appears well-developed and well-nourished.  Eyes: No scleral icterus.  Neck: Neck supple.  Cardiovascular: Normal rate and regular rhythm.   Respiratory: Effort normal and breath sounds normal. She has no wheezes. She has no rales.  GI: Soft. Bowel sounds are normal. There is tenderness (ruq primarily no murphys now).  Neurological: She is alert and oriented to person, place, and time.     Assessment/Plan Cholecystitis  She appears to have cholecystitis on exam and by [place].  Wbc high.  Has longer duration of symptoms  but I think lap chole in am would be best plan. We discussed admission, iv abx, npo and reevaluation in am.  Jayson Waterhouse 06/11/2014, 12:13 AM

## 2014-06-11 NOTE — Progress Notes (Signed)
  Subjective: Patient still quite tender, requiring pain meds  Objective: Vital signs in last 24 hours: Temp:  [98.2 F (36.8 C)-98.5 F (36.9 C)] 98.2 F (36.8 C) (03/20 0603) Pulse Rate:  [64-94] 70 (03/20 0603) Resp:  [16-20] 16 (03/20 0603) BP: (95-122)/(55-76) 104/70 mmHg (03/20 0603) SpO2:  [98 %-100 %] 100 % (03/20 0603) Weight:  [70.308 kg (155 lb)-71.3 kg (157 lb 3 oz)] 71.3 kg (157 lb 3 oz) (03/20 0250)    Intake/Output from previous day: 03/19 0701 - 03/20 0700 In: 393.3 [I.V.:393.3] Out: -  Intake/Output this shift:    General appearance: alert, cooperative and no distress GI: tender in RUQ; no palpable masses  Lab Results:   Recent Labs  06/10/14 2125  WBC 23.1*  HGB 12.5  HCT 35.2*  PLT 371   BMET  Recent Labs  06/10/14 2125  NA 136  K 3.4*  CL 102  CO2 29  GLUCOSE 150*  BUN 8  CREATININE 0.74  CALCIUM 9.2   PT/INR No results for input(s): LABPROT, INR in the last 72 hours. ABG No results for input(s): PHART, HCO3 in the last 72 hours.  Invalid input(s): PCO2, PO2  Studies/Results: Koreas Abdomen Limited Ruq  06/10/2014   CLINICAL DATA:  RIGHT abdominal pain for 1 week.  EXAM: US ABDOMEN LIMITED - RIGHT UPPER QUADRANT  COMPARISON:  None.  FINDINGS: Gallbladder:  Echogenic mobile gallstones with acoustic shadowing in addition to a 14 mm gallstone at the gallbladder neck. Mild gallbladder wall thickening, 3.6 mm. No sonographic Murphy's sign was elicited.  Common bile duct:  Diameter: 1.7 mm  Liver:  No focal lesion identified. Within normal limits in parenchymal echogenicity. All no intrahepatic biliary dilatation. Hepatopetal portal vein.  IMPRESSION: Cholelithiasis and mild gallbladder wall thickening without sonographic Murphy's sign. This could reflect chronic cholecystitis, less likely acute cholecystitis. Nonmobile gallstone at the gallbladder neck.   Electronically Signed   By: Awilda Metroourtnay  Bloomer   On: 06/10/2014 23:19     Anti-infectives: Anti-infectives    Start     Dose/Rate Route Frequency Ordered Stop   06/11/14 0600  piperacillin-tazobactam (ZOSYN) IVPB 3.375 g     3.375 g 12.5 mL/hr over 240 Minutes Intravenous 3 times per day 06/11/14 0247     06/10/14 2330  piperacillin-tazobactam (ZOSYN) IVPB 3.375 g     3.375 g 100 mL/hr over 30 Minutes Intravenous  Once 06/10/14 2327 06/11/14 0103      Assessment/Plan: Acute cholecystitis Surgery today - laparoscopic cholecystectomy with intraoperative cholangiogram.  The surgical procedure has been discussed with the patient.  Potential risks, benefits, alternative treatments, and expected outcomes have been explained.  All of the patient's questions at this time have been answered.  The likelihood of reaching the patient's treatment goal is good.  The patient understand the proposed surgical procedure and wishes to proceed.   LOS: 0 days    Ellison Leisure K. 06/11/2014

## 2014-06-11 NOTE — Op Note (Signed)
Laparoscopic Cholecystectomy with IOC Procedure Note  Indications: This patient presents with symptomatic gallbladder disease and will undergo laparoscopic cholecystectomy.  Pre-operative Diagnosis: Calculus of gallbladder with acute cholecystitis, without mention of obstruction  Post-operative Diagnosis: Same  Surgeon: Twylla Arceneaux K.   Assistants: Dr. Carolynne Edouardoth  Anesthesia: General endotracheal anesthesia  ASA Class: 1  Procedure Details  The patient was seen again in the Holding Room. The risks, benefits, complications, treatment options, and expected outcomes were discussed with the patient. The possibilities of reaction to medication, pulmonary aspiration, perforation of viscus, bleeding, recurrent infection, finding a normal gallbladder, the need for additional procedures, failure to diagnose a condition, the possible need to convert to an open procedure, and creating a complication requiring transfusion or operation were discussed with the patient. The likelihood of improving the patient's symptoms with return to their baseline status is good.  The patient and/or family concurred with the proposed plan, giving informed consent. The site of surgery properly noted. The patient was taken to Operating Room, identified as Kristin Jensen and the procedure verified as Laparoscopic Cholecystectomy with Intraoperative Cholangiogram. A Time Out was held and the above information confirmed.  Prior to the induction of general anesthesia, antibiotic prophylaxis was administered. General endotracheal anesthesia was then administered and tolerated well. After the induction, the abdomen was prepped with Chloraprep and draped in the sterile fashion. The patient was positioned in the supine position.  Local anesthetic agent was injected into the skin near the umbilicus and an incision made. We dissected down to the abdominal fascia with blunt dissection.  The fascia was incised vertically and we entered the  peritoneal cavity bluntly.  A pursestring suture of 0-Vicryl was placed around the fascial opening.  The Hasson cannula was inserted and secured with the stay suture.  Pneumoperitoneum was then created with CO2 and tolerated well without any adverse changes in the patient's vital signs. An 11-mm port was placed in the subxiphoid position.  Two 5-mm ports were placed in the right upper quadrant. All skin incisions were infiltrated with a local anesthetic agent before making the incision and placing the trocars.   We positioned the patient in reverse Trendelenburg, tilted slightly to the patient's left.  The gallbladder was identified, the fundus grasped and retracted cephalad.  The gallbladder was quite thickened and edematous. Adhesions were lysed bluntly and with the electrocautery where indicated, taking care not to injure any adjacent organs or viscus. The infundibulum was grasped and retracted laterally, exposing the peritoneum overlying the triangle of Calot. This was then divided and exposed in a blunt fashion. A critical view of the cystic duct and cystic artery was obtained.  The cystic duct was clearly identified and bluntly dissected circumferentially. The cystic duct was ligated with a clip distally.   An incision was made in the cystic duct and the [facility] introduced. The catheter was secured using a clip. A cholangiogram was then obtained which showed good visualization of the distal and proximal biliary tree with no sign of filling defects or obstruction.  Contrast flowed easily into the duodenum. The catheter was then removed.   The cystic duct was then ligated with clips and divided. The cystic artery was identified, dissected free, ligated with clips and divided as well.   The gallbladder was dissected from the liver bed in retrograde fashion with the electrocautery. The gallbladder was removed and placed in an Endocatch sac. The liver bed was irrigated and inspected.  Hemostasis was achieved with the electrocautery. Copious irrigation  was utilized and was repeatedly aspirated until clear.  The gallbladder and Endocatch sac were then removed through the umbilical port site.  The pursestring suture was used to close the umbilical fascia.    We again inspected the right upper quadrant for hemostasis.  Pneumoperitoneum was released as we removed the trocars.  4-0 Monocryl was used to close the skin.   Benzoin, steri-strips, and clean dressings were applied. The patient was then extubated and brought to the recovery room in stable condition. Instrument, sponge, and needle counts were correct at closure and at the conclusion of the case.   Findings: Cholecystitis with Cholelithiasis  Estimated Blood Loss: Minimal         Drains: none         Specimens: Gallbladder           Complications: None; patient tolerated the procedure well.         Disposition: PACU - hemodynamically stable.         Condition: stable   Kristin Arms. Corliss Skains, MD, [facility]  General/ Trauma Surgery  06/11/2014 11:22 AM

## 2014-06-11 NOTE — Anesthesia Procedure Notes (Signed)
Procedure Name: Intubation Date/Time: 06/11/2014 10:10 AM Performed by: Suzy Bouchard Pre-anesthesia Checklist: Patient identified, Timeout performed, Emergency Drugs available, Suction available and Patient being monitored Patient Re-evaluated:Patient Re-evaluated prior to inductionOxygen Delivery Method: Circle system utilized Preoxygenation: Pre-oxygenation with 100% oxygen Intubation Type: IV induction, Cricoid Pressure applied and Rapid sequence Laryngoscope Size: Miller and 2 Grade View: Grade I Tube type: Oral Number of attempts: 1 Airway Equipment and Method: Stylet and LTA kit utilized Placement Confirmation: ETT inserted through vocal cords under direct vision,  breath sounds checked- equal and bilateral and positive ETCO2 Secured at: 22 cm Tube secured with: Tape Dental Injury: Teeth and Oropharynx as per pre-operative assessment

## 2014-06-12 ENCOUNTER — Encounter (HOSPITAL_COMMUNITY): Payer: Self-pay | Admitting: Surgery

## 2014-06-12 LAB — CBC
HCT: 31.5 % — ABNORMAL LOW (ref 36.0–46.0)
Hemoglobin: 10.9 g/dL — ABNORMAL LOW (ref 12.0–15.0)
MCH: 27.3 pg (ref 26.0–34.0)
MCHC: 34.6 g/dL (ref 30.0–36.0)
MCV: 78.8 fL (ref 78.0–100.0)
PLATELETS: 313 10*3/uL (ref 150–400)
RBC: 4 MIL/uL (ref 3.87–5.11)
RDW: 14 % (ref 11.5–15.5)
WBC: 18.4 10*3/uL — AB (ref 4.0–10.5)

## 2014-06-12 MED ORDER — HYDROCODONE-ACETAMINOPHEN 5-325 MG PO TABS: 1.0000 | ORAL_TABLET | Freq: Four times a day (QID) | ORAL | Status: AC | PRN

## 2014-06-12 MED ORDER — HYDROCODONE-ACETAMINOPHEN 5-325 MG PO TABS
1.0000 | ORAL_TABLET | ORAL | Status: DC | PRN
  Administered 2014-06-12 (×2): 2 via ORAL
  Filled 2014-06-12 (×2): qty 2

## 2014-06-12 NOTE — Discharge Instructions (Signed)

## 2014-06-12 NOTE — Discharge Summary (Signed)
[facility] Discharge Summary   Patient ID: Margery Szostak MRN: [id number] DOB/AGE: 37-May-1977 37 y.o.  Admit date: 06/10/2014 Discharge date: 06/12/2014  Admitting Diagnosis: Acute Cholecystitis with cholelithiasis  Discharge Diagnosis Patient Active Problem List   Diagnosis Date Noted  . Calculus of gallbladder with acute cholecystitis 06/11/2014    Consultants None  Imaging: Dg Cholangiogram Operative  06/11/2014   CLINICAL DATA:  Intraoperative cholangiogram following cholecystectomy.  EXAM: INTRAOPERATIVE CHOLANGIOGRAM  TECHNIQUE: Cholangiographic images from the C-arm fluoroscopic device were submitted for interpretation post-operatively. Please see the procedural report for the amount of contrast and the fluoroscopy time utilized.  COMPARISON:  CT, 02/05/2014  FINDINGS: Intra and extrahepatic bile ducts are normal in caliber. No filling defect is seen to suggest a duct stone. There is no contrast extravasation. Contrast freely enters the second portion of the duodenum.  IMPRESSION: Normal intraoperative cholangiogram.   Electronically Signed   By: Amie Portland M.D.   On: 06/11/2014 11:09   US Abdomen Limited Ruq  06/10/2014   CLINICAL DATA:  RIGHT abdominal pain for 1 week.  EXAM: US ABDOMEN LIMITED - RIGHT UPPER QUADRANT  COMPARISON:  None.  FINDINGS: Gallbladder:  Echogenic mobile gallstones with acoustic shadowing in addition to a 14 mm gallstone at the gallbladder neck. Mild gallbladder wall thickening, 3.6 mm. No sonographic Murphy's sign was elicited.  Common bile duct:  Diameter: 1.7 mm  Liver:  No focal lesion identified. Within normal limits in parenchymal echogenicity. All no intrahepatic biliary dilatation. Hepatopetal portal vein.  IMPRESSION: Cholelithiasis and mild gallbladder wall thickening without sonographic Murphy's sign. This could reflect chronic cholecystitis, less likely acute cholecystitis. Nonmobile gallstone at the gallbladder neck.   Electronically  Signed   By: Awilda Metro   On: 06/10/2014 23:19    Procedures Dr. Corliss Skains (06/11/14) - Laparoscopic Cholecystectomy with NEGATIVE [facility]   Hospital Course:  39 y/o female with right sided abdominal pain for at least 2 weeks that today got much worse. She states she has been to hospital in [place]. Has been evaluated with ct scan and [place] she says and they told her she was constipated. She arrives today with worse ruq and right side pain that was made worse with eating this am. She has n/v. No fevers. Passing flatus but constipated. She has attempted a suppository. She was seen in er and found to have gallstone in neck on [place] with wbc of 23k.   Patient was admitted and underwent procedure listed above.  Tolerated procedure well and was transferred to the floor.  Diet was advanced as tolerated.  On POD #1, the patient was voiding well, tolerating diet, ambulating well, pain well controlled, vital signs stable, incisions c/d/i and felt stable for discharge home.  Patient will follow up in our office in 2 weeks and knows to call with questions or concerns.  Physical Exam: General:  Alert, NAD, pleasant, comfortable Abd:  Soft, ND, mild tenderness, incisions C/D/I with tegaderm and gauze dressings in place.     Medication List    STOP taking these medications        doxycycline 100 MG capsule  Commonly known as:  VIBRAMYCIN     ondansetron 4 MG disintegrating tablet  Commonly known as:  ZOFRAN ODT      TAKE these medications        acetaminophen 500 MG tablet  Commonly known as:  TYLENOL  Take 1,000 mg by mouth every 6 (six) hours as needed for moderate pain.  HYDROcodone-acetaminophen 5-325 MG per tablet  Commonly known as:  NORCO/VICODIN  Take 1-2 tablets by mouth every 6 (six) hours as needed for moderate pain or severe pain.     ibuprofen 200 MG tablet  Commonly known as:  ADVIL,MOTRIN  Take 400 mg by mouth every 6 (six) hours as needed for moderate pain.          Follow-up Information    Follow up with [facility] [place] On 06/27/2014.   Why:  For post-operation check. Your appointment is at 3:45pm, please arrive at least 30 min before your appointment to complete your check in paperwork.  If you are unable to arrive 30 min prior to your appointment time we may have to cancel or reschedule you   Contact information:   [address] [place] [id number] [phone]      Signed: Aris GeorgiaMegan Dort, [facility] [phone]  06/12/2014, 8:57 AM

## 2015-04-01 IMAGING — CT CT ABD-PELV W/ CM
2 of 3 series · 16 of 46 positions shown, 18 images · IV contrast (APPLIED)
Comparison: None.

CLINICAL DATA: Right flank pain intermittent for 2 weeks. Elevated
WBC

EXAM:
CT ABDOMEN AND PELVIS WITH CONTRAST
TECHNIQUE: Multidetector CT imaging of the abdomen and pelvis was performed
using the standard protocol following bolus administration of
intravenous contrast.
CONTRAST:  50mL OMNIPAQUE IOHEXOL 300 MG/ML SOLN, 100mL OMNIPAQUE
IOHEXOL 300 MG/ML SOLN

[Series 2: abd/pelvis 5.0 b31f · axial · 0.67mm/px · z∈[-668,-318]mm · 13 of 82 slices shown, 15 images]
[im 6/82  soft-tissue]
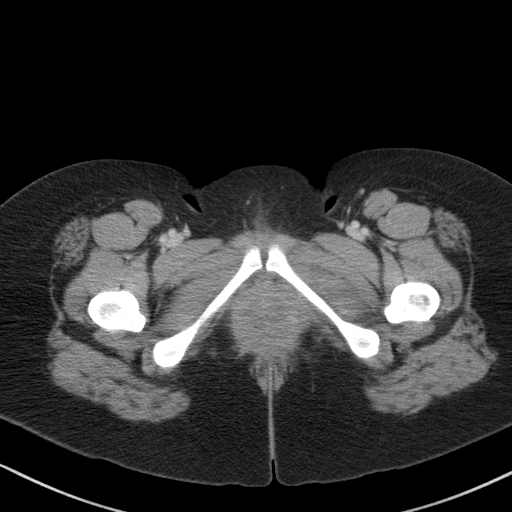
[im 6/82  bone]
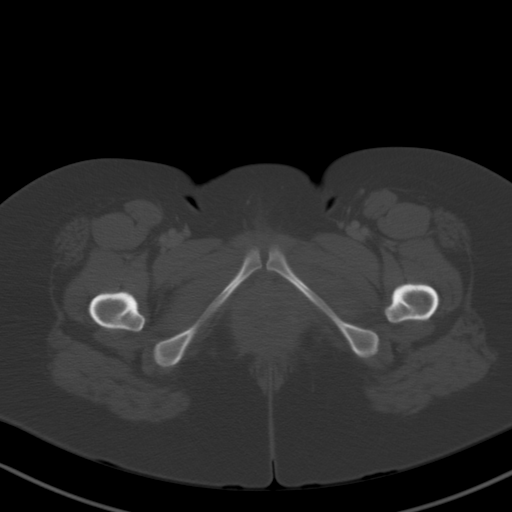
[im 11/82  soft-tissue]
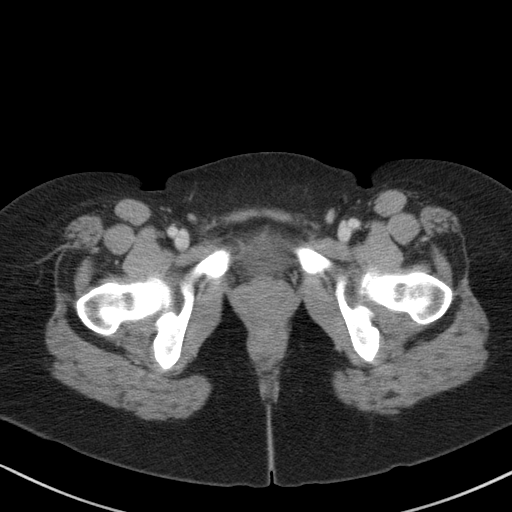
[im 16/82  soft-tissue]
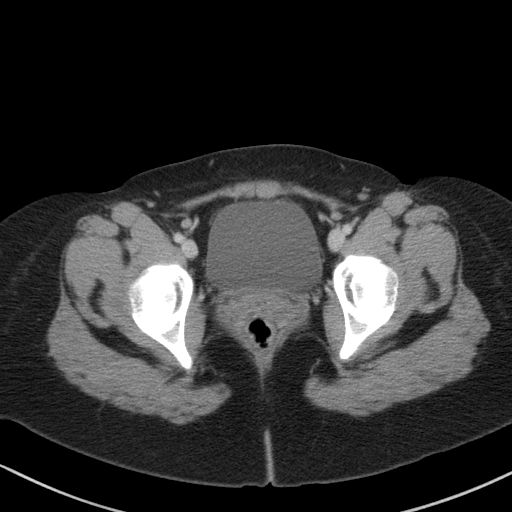
[im 24/82  soft-tissue]
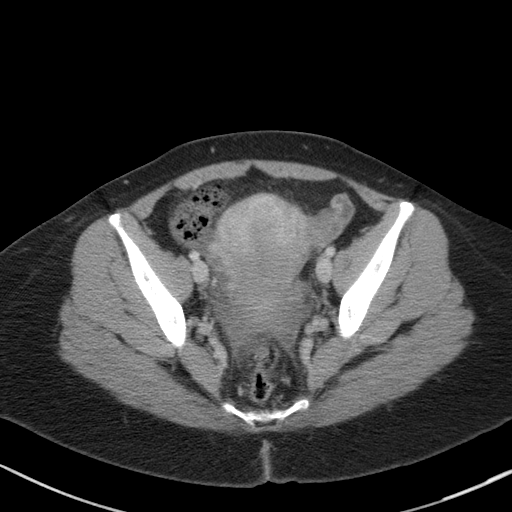
[im 29/82  soft-tissue]
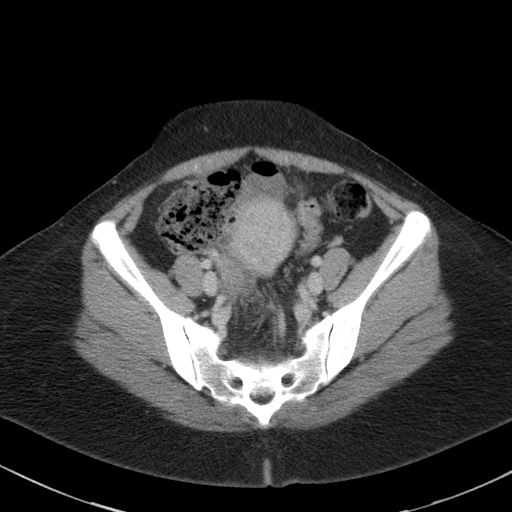
[im 34/82  soft-tissue]
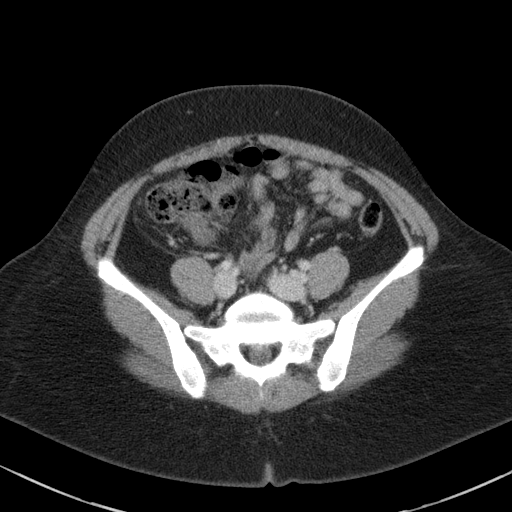
[im 42/82  soft-tissue]
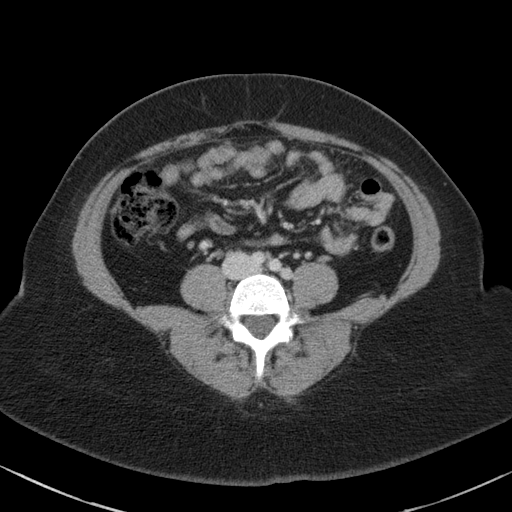
[im 48/82  soft-tissue]
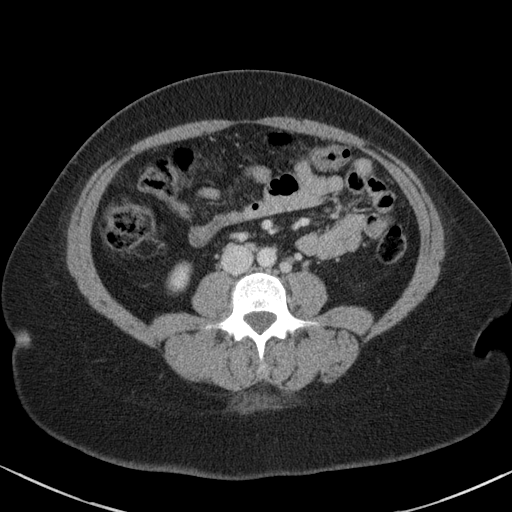
[im 53/82  soft-tissue]
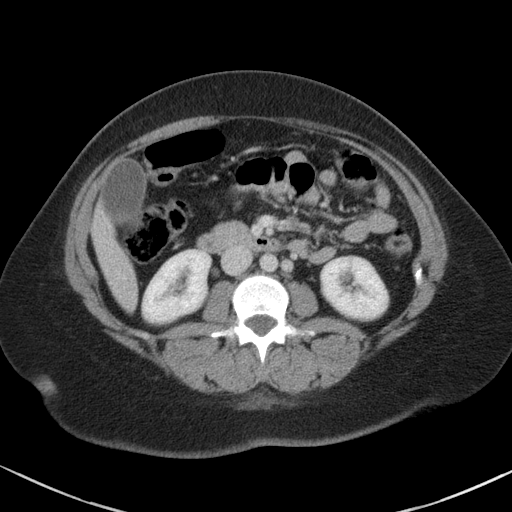
[im 53/82  bone]
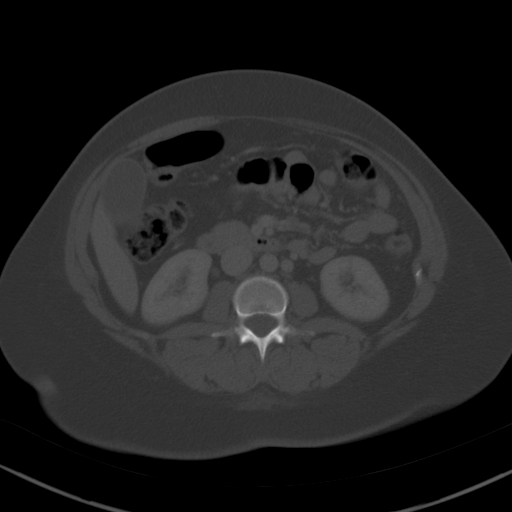
[im 58/82  soft-tissue]
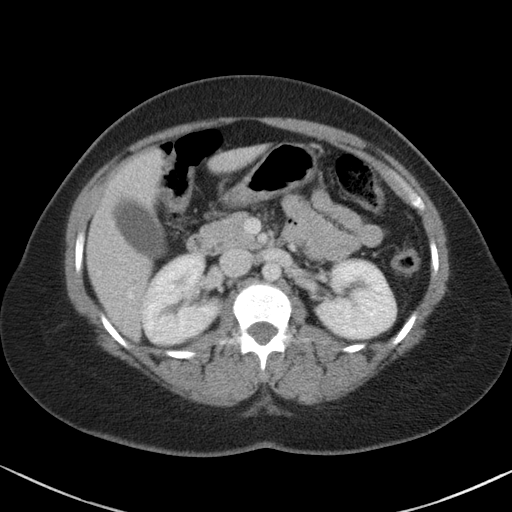
[im 66/82  soft-tissue]
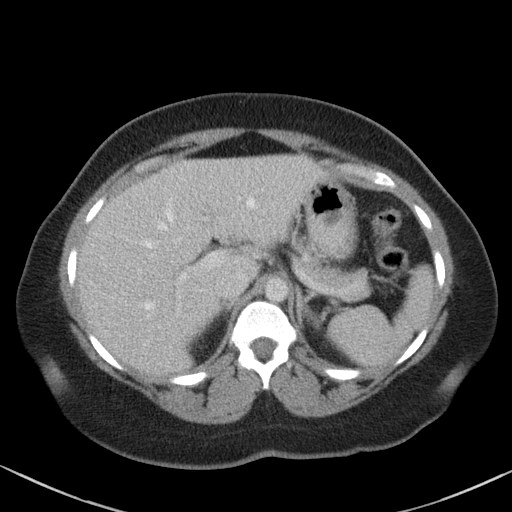
[im 71/82  soft-tissue]
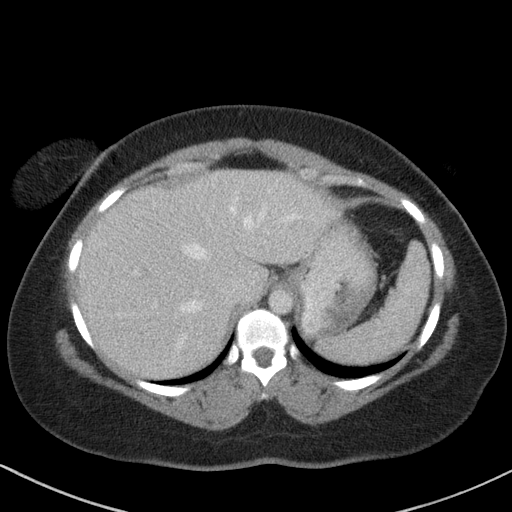
[im 76/82  soft-tissue]
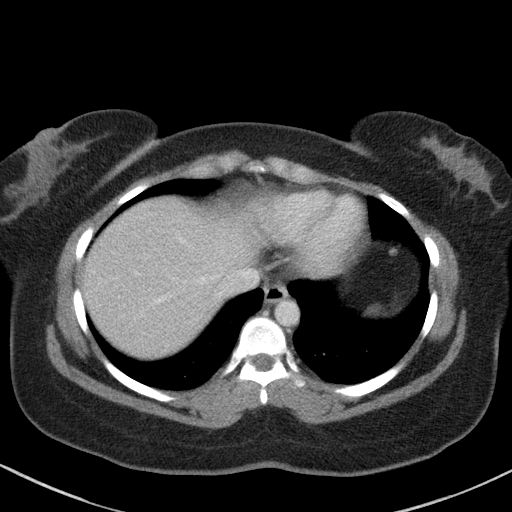

[Series 5: abd/pelvis 3.0 coronal · coronal · 0.70mm/px · 3 of 90 slices shown]
[im 30/90  soft-tissue]
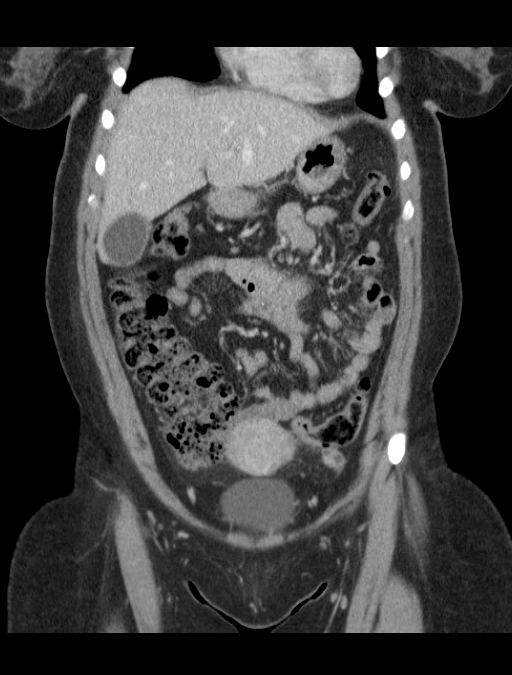
[im 40/90  soft-tissue]
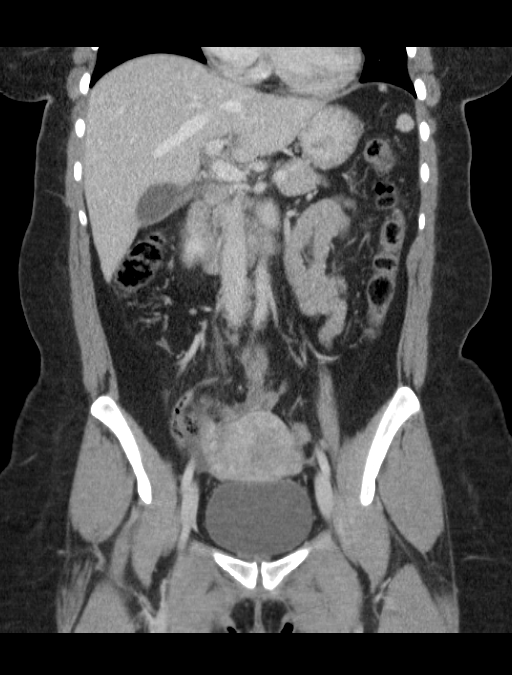
[im 50/90  soft-tissue]
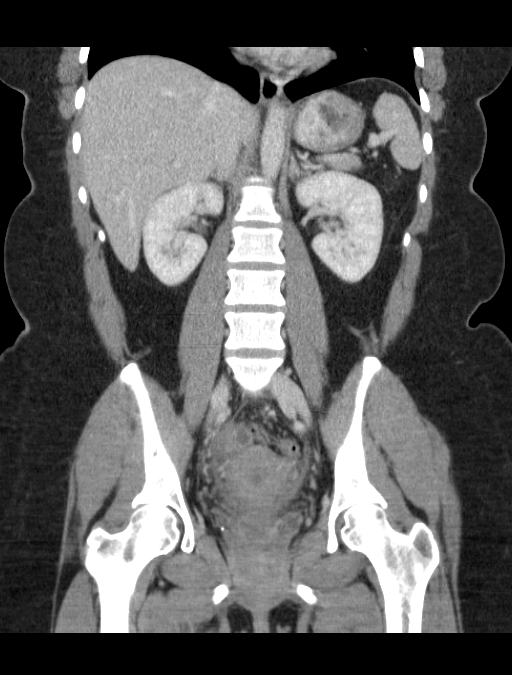

[16 of 46 positions shown; findings below may reference images not displayed]

FINDINGS: The lung bases are clear.

The liver demonstrates no focal abnormality. There is no
intrahepatic or extrahepatic biliary ductal dilatation. The
gallbladder is normal. The spleen demonstrates no focal abnormality.
The kidneys, adrenal glands and pancreas are normal. The bladder is
unremarkable.

There is a tubular fluid-filled structure in the right adnexal
region with peripheral enhancement likely representing the right
fallopian tube. There is surrounding small amount of fluid and
inflammatory changes. The lower half of the uterus enhances less
than the fundus concerning for edema. There is a small amount of
pelvic free fluid.

The stomach, duodenum, small intestine, and large intestine
demonstrate no contrast extravasation or dilatation. There is a
normal caliber appendix in the right lower quadrant without
periappendiceal inflammatory changes. There is no pneumoperitoneum,
pneumatosis, or portal venous gas. There is no lymphadenopathy.

The abdominal aorta is normal in caliber .

There are no lytic or sclerotic osseous lesions.
IMPRESSION: 1. The lower half the uterus enhances less than the fundus
concerning for edema as can be seen with endometritis. There is a
fluid-filled peripherally enhancing right fallopian tube concerning
for pyosalpinx. Gyn consultation recommended.

## 2015-08-05 IMAGING — RF DG CHOLANGIOGRAM OPERATIVE
1 series · 5 of 5 positions shown · non-contrast
Comparison: CT, 02/05/2014

CLINICAL DATA: Intraoperative cholangiogram following
cholecystectomy.

EXAM:
INTRAOPERATIVE CHOLANGIOGRAM
TECHNIQUE: Cholangiographic images from the C-arm fluoroscopic device were
submitted for interpretation post-operatively. Please see the
procedural report for the amount of contrast and the fluoroscopy
time utilized.

[Series 1: run · 2 acquisitions, 5 frames shown]
[im 1/2]
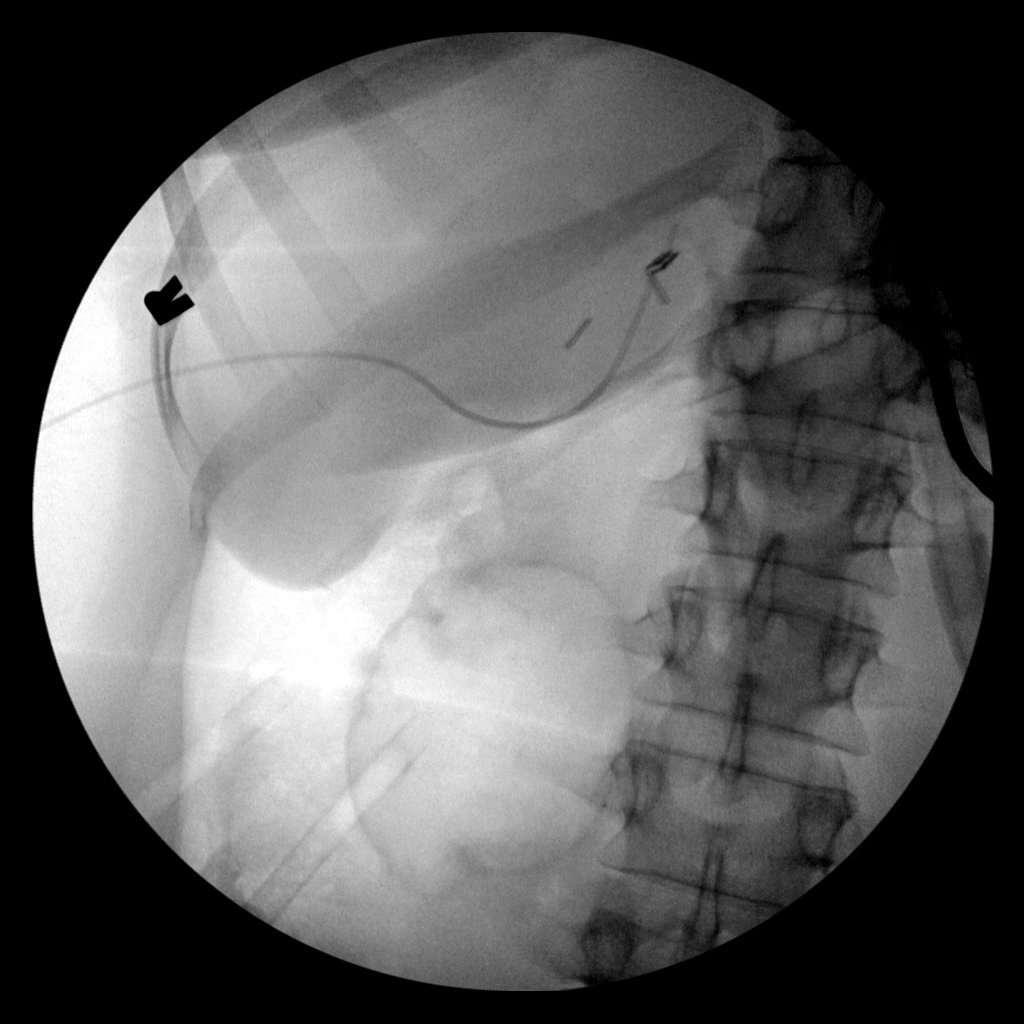
[im 1/2]
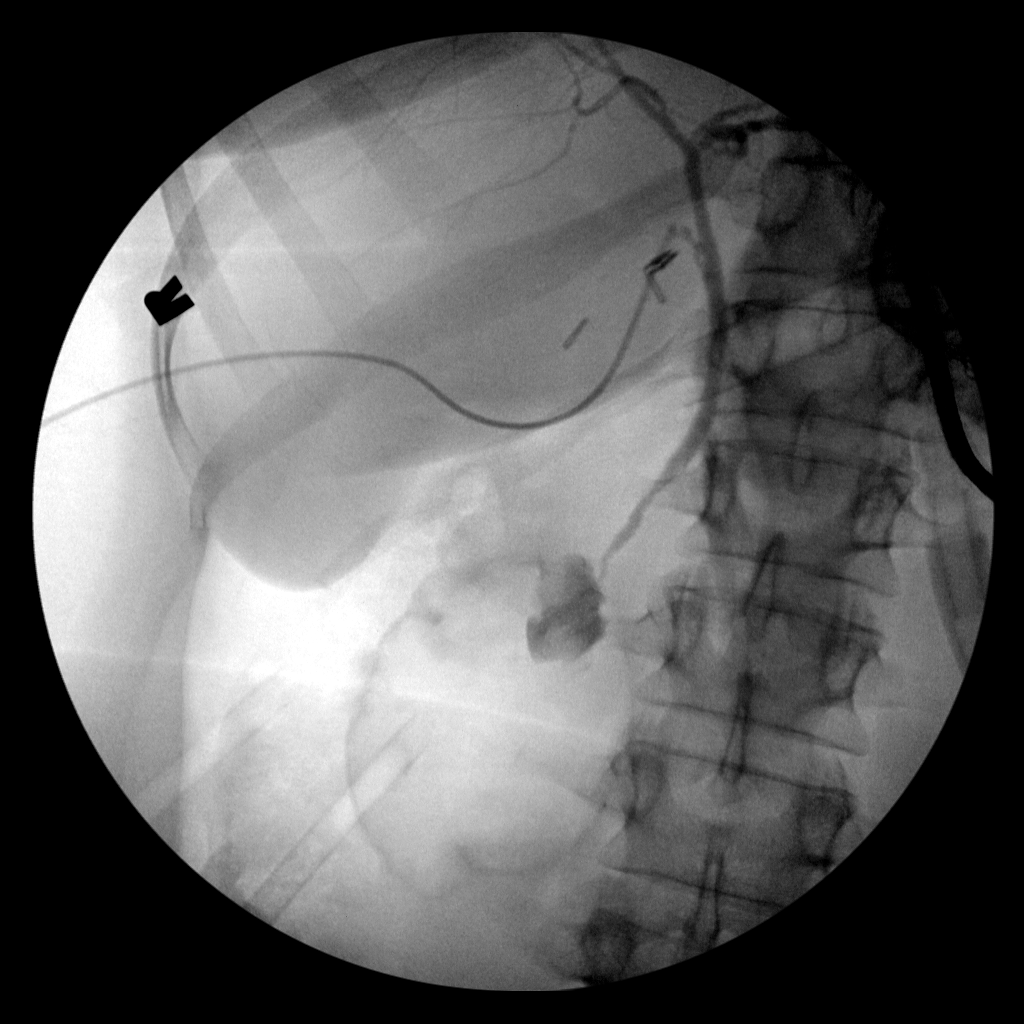
[im 1/2]
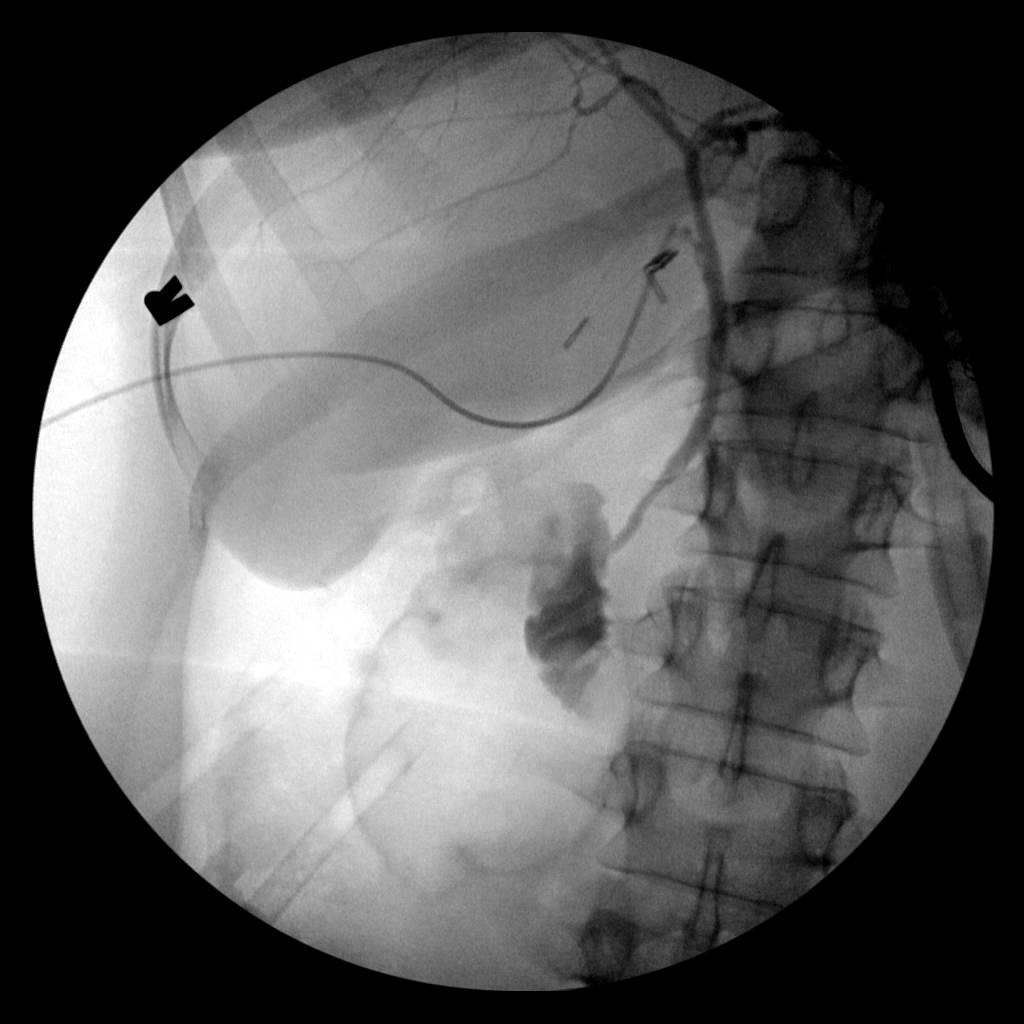
[im 1/2]
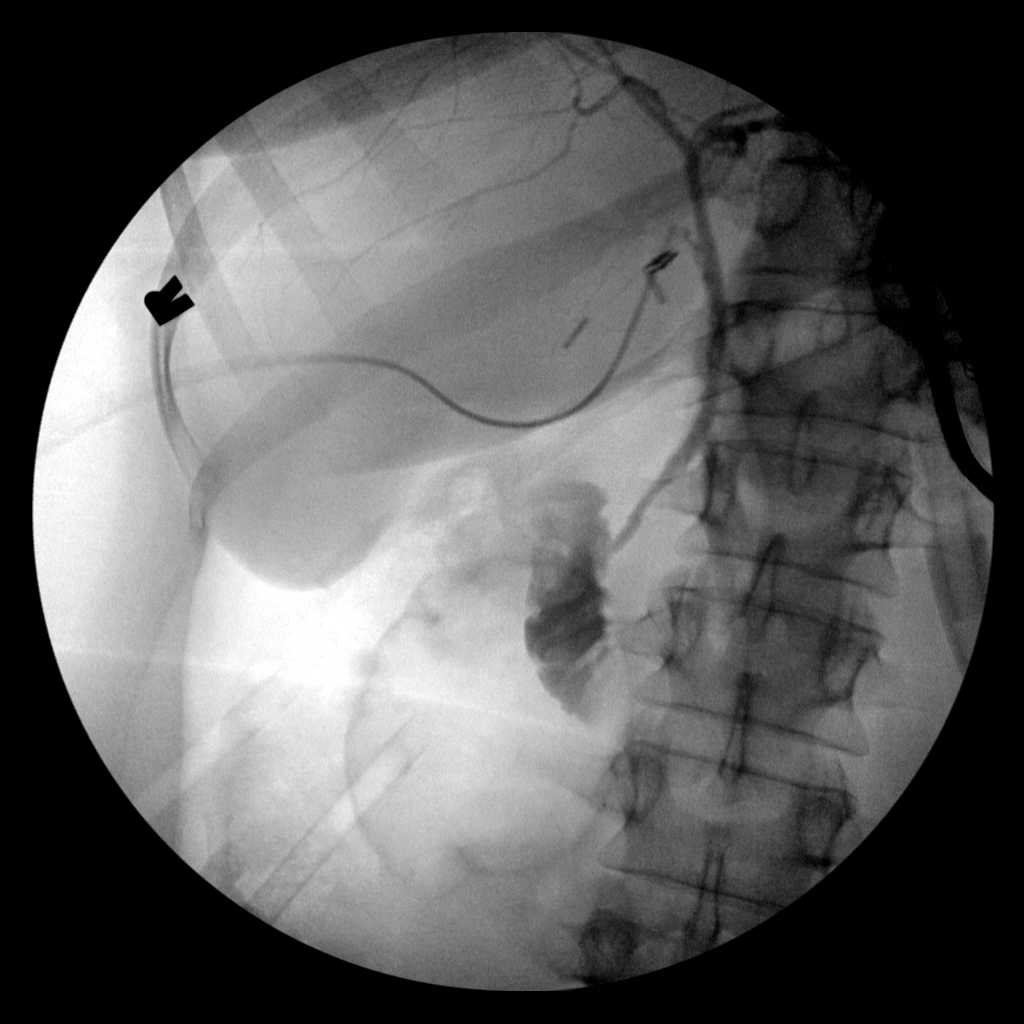
[im 2/2]
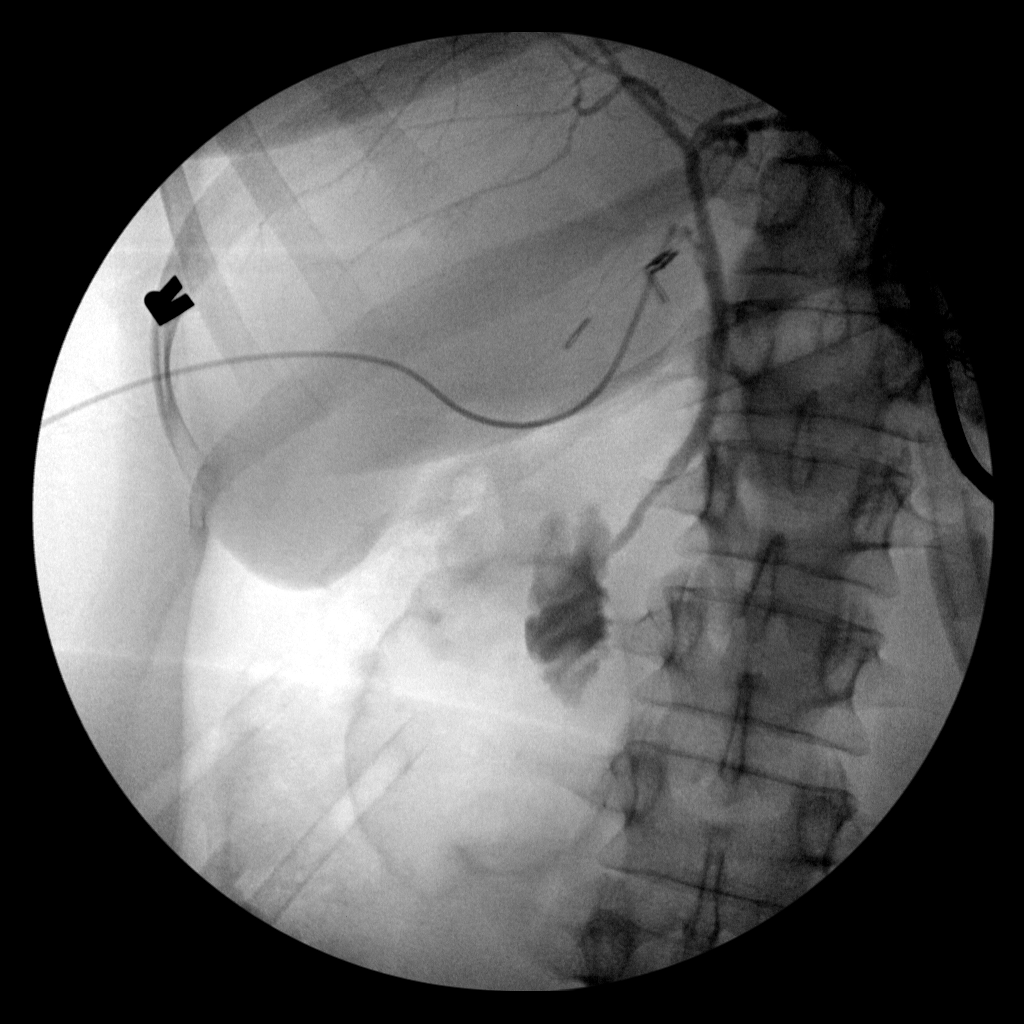

[5 of 5 positions shown; findings below may reference images not displayed]

FINDINGS: Intra and extrahepatic bile ducts are normal in caliber. No filling
defect is seen to suggest a duct stone. There is no contrast
extravasation. Contrast freely enters the second portion of the
duodenum.
IMPRESSION: Normal intraoperative cholangiogram.
